# Patient Record
Sex: Female | Born: 1955
Health system: Southern US, Community
[De-identification: ages and names within clinical notes are randomized; demographics above are authoritative.]

## PROBLEM LIST (undated history)

## (undated) DIAGNOSIS — E039 Hypothyroidism, unspecified: Secondary | ICD-10-CM

## (undated) DIAGNOSIS — M810 Age-related osteoporosis without current pathological fracture: Secondary | ICD-10-CM

## (undated) DIAGNOSIS — I219 Acute myocardial infarction, unspecified: Secondary | ICD-10-CM

## (undated) DIAGNOSIS — E119 Type 2 diabetes mellitus without complications: Secondary | ICD-10-CM

## (undated) DIAGNOSIS — R911 Solitary pulmonary nodule: Secondary | ICD-10-CM

## (undated) DIAGNOSIS — G629 Polyneuropathy, unspecified: Secondary | ICD-10-CM

## (undated) DIAGNOSIS — F909 Attention-deficit hyperactivity disorder, unspecified type: Secondary | ICD-10-CM

## (undated) DIAGNOSIS — F329 Major depressive disorder, single episode, unspecified: Secondary | ICD-10-CM

## (undated) DIAGNOSIS — M1990 Unspecified osteoarthritis, unspecified site: Secondary | ICD-10-CM

## (undated) DIAGNOSIS — G43909 Migraine, unspecified, not intractable, without status migrainosus: Secondary | ICD-10-CM

## (undated) DIAGNOSIS — F32A Depression, unspecified: Secondary | ICD-10-CM

## (undated) DIAGNOSIS — Z9882 Breast implant status: Secondary | ICD-10-CM

## (undated) DIAGNOSIS — C801 Malignant (primary) neoplasm, unspecified: Secondary | ICD-10-CM

## (undated) DIAGNOSIS — I2510 Atherosclerotic heart disease of native coronary artery without angina pectoris: Secondary | ICD-10-CM

## (undated) DIAGNOSIS — Z86010 Personal history of colonic polyps: Secondary | ICD-10-CM

## (undated) DIAGNOSIS — F419 Anxiety disorder, unspecified: Secondary | ICD-10-CM

## (undated) HISTORY — PX: OTHER SURGICAL HISTORY: SHX169

## (undated) HISTORY — DX: Migraine, unspecified, not intractable, without status migrainosus: G43.909

## (undated) HISTORY — DX: Personal history of colonic polyps: Z86.010

## (undated) HISTORY — PX: BILATERAL CARPAL TUNNEL RELEASE: SHX6508

## (undated) HISTORY — DX: Malignant (primary) neoplasm, unspecified: C80.1

## (undated) HISTORY — PX: BLEPHAROPLASTY: SUR158

## (undated) HISTORY — DX: Unspecified osteoarthritis, unspecified site: M19.90

## (undated) HISTORY — DX: Anxiety disorder, unspecified: F41.9

## (undated) HISTORY — DX: Major depressive disorder, single episode, unspecified: F32.9

## (undated) HISTORY — PX: APPENDECTOMY: SHX54

## (undated) HISTORY — DX: Type 2 diabetes mellitus without complications: E11.9

## (undated) HISTORY — DX: Hypothyroidism, unspecified: E03.9

## (undated) HISTORY — DX: Acute myocardial infarction, unspecified: I21.9

## (undated) HISTORY — PX: SHOULDER SURGERY: SHX246

## (undated) HISTORY — DX: Depression, unspecified: F32.A

## (undated) HISTORY — PX: CATARACT EXTRACTION: SUR2

## (undated) HISTORY — DX: Breast implant status: Z98.82

## (undated) HISTORY — DX: Atherosclerotic heart disease of native coronary artery without angina pectoris: I25.10

## (undated) HISTORY — DX: Solitary pulmonary nodule: R91.1

## (undated) HISTORY — PX: ABDOMINAL HYSTERECTOMY: SHX81

## (undated) HISTORY — DX: Age-related osteoporosis without current pathological fracture: M81.0

## (undated) HISTORY — DX: Polyneuropathy, unspecified: G62.9

## (undated) HISTORY — DX: Attention-deficit hyperactivity disorder, unspecified type: F90.9

---

## 2005-06-15 ENCOUNTER — Emergency Department: Payer: Self-pay | Admitting: General Practice

## 2009-10-17 DIAGNOSIS — F909 Attention-deficit hyperactivity disorder, unspecified type: Secondary | ICD-10-CM | POA: Insufficient documentation

## 2009-10-21 DIAGNOSIS — E559 Vitamin D deficiency, unspecified: Secondary | ICD-10-CM | POA: Insufficient documentation

## 2009-10-24 DIAGNOSIS — IMO0002 Reserved for concepts with insufficient information to code with codable children: Secondary | ICD-10-CM | POA: Insufficient documentation

## 2009-10-24 DIAGNOSIS — E1039 Type 1 diabetes mellitus with other diabetic ophthalmic complication: Secondary | ICD-10-CM | POA: Insufficient documentation

## 2010-06-29 DIAGNOSIS — C801 Malignant (primary) neoplasm, unspecified: Secondary | ICD-10-CM

## 2010-06-29 HISTORY — PX: MASTECTOMY: SHX3

## 2010-06-29 HISTORY — DX: Malignant (primary) neoplasm, unspecified: C80.1

## 2011-06-30 DIAGNOSIS — I219 Acute myocardial infarction, unspecified: Secondary | ICD-10-CM

## 2011-06-30 HISTORY — DX: Acute myocardial infarction, unspecified: I21.9

## 2011-06-30 HISTORY — PX: CORONARY ARTERY BYPASS GRAFT: SHX141

## 2012-02-01 DIAGNOSIS — Z79899 Other long term (current) drug therapy: Secondary | ICD-10-CM | POA: Insufficient documentation

## 2012-02-01 DIAGNOSIS — R5383 Other fatigue: Secondary | ICD-10-CM | POA: Insufficient documentation

## 2012-02-01 DIAGNOSIS — T148XXA Other injury of unspecified body region, initial encounter: Secondary | ICD-10-CM | POA: Insufficient documentation

## 2012-02-01 DIAGNOSIS — M791 Myalgia, unspecified site: Secondary | ICD-10-CM | POA: Insufficient documentation

## 2013-09-25 DIAGNOSIS — E1110 Type 2 diabetes mellitus with ketoacidosis without coma: Secondary | ICD-10-CM | POA: Insufficient documentation

## 2015-02-13 LAB — HM DIABETES EYE EXAM

## 2015-04-08 ENCOUNTER — Other Ambulatory Visit: Payer: Self-pay

## 2015-04-08 ENCOUNTER — Encounter: Payer: Self-pay | Admitting: Family Medicine

## 2015-04-08 ENCOUNTER — Ambulatory Visit (INDEPENDENT_AMBULATORY_CARE_PROVIDER_SITE_OTHER): Payer: BLUE CROSS/BLUE SHIELD

## 2015-04-08 ENCOUNTER — Ambulatory Visit (INDEPENDENT_AMBULATORY_CARE_PROVIDER_SITE_OTHER): Payer: BLUE CROSS/BLUE SHIELD | Admitting: Family Medicine

## 2015-04-08 VITALS — BP 118/72 | HR 80 | Temp 98.1°F | Resp 16 | Ht 67.0 in | Wt 171.3 lb

## 2015-04-08 DIAGNOSIS — G609 Hereditary and idiopathic neuropathy, unspecified: Secondary | ICD-10-CM | POA: Insufficient documentation

## 2015-04-08 DIAGNOSIS — Z951 Presence of aortocoronary bypass graft: Secondary | ICD-10-CM | POA: Diagnosis not present

## 2015-04-08 DIAGNOSIS — M79671 Pain in right foot: Secondary | ICD-10-CM

## 2015-04-08 DIAGNOSIS — F5104 Psychophysiologic insomnia: Secondary | ICD-10-CM | POA: Insufficient documentation

## 2015-04-08 DIAGNOSIS — K5900 Constipation, unspecified: Secondary | ICD-10-CM

## 2015-04-08 DIAGNOSIS — Z23 Encounter for immunization: Secondary | ICD-10-CM

## 2015-04-08 DIAGNOSIS — H359 Unspecified retinal disorder: Secondary | ICD-10-CM

## 2015-04-08 DIAGNOSIS — K5909 Other constipation: Secondary | ICD-10-CM | POA: Insufficient documentation

## 2015-04-08 DIAGNOSIS — R197 Diarrhea, unspecified: Secondary | ICD-10-CM | POA: Diagnosis not present

## 2015-04-08 DIAGNOSIS — R140 Abdominal distension (gaseous): Secondary | ICD-10-CM

## 2015-04-08 DIAGNOSIS — E1140 Type 2 diabetes mellitus with diabetic neuropathy, unspecified: Secondary | ICD-10-CM

## 2015-04-08 DIAGNOSIS — M797 Fibromyalgia: Secondary | ICD-10-CM | POA: Insufficient documentation

## 2015-04-08 DIAGNOSIS — E109 Type 1 diabetes mellitus without complications: Secondary | ICD-10-CM | POA: Insufficient documentation

## 2015-04-08 DIAGNOSIS — E11319 Type 2 diabetes mellitus with unspecified diabetic retinopathy without macular edema: Secondary | ICD-10-CM | POA: Insufficient documentation

## 2015-04-08 DIAGNOSIS — F349 Persistent mood [affective] disorder, unspecified: Secondary | ICD-10-CM | POA: Insufficient documentation

## 2015-04-08 DIAGNOSIS — Z794 Long term (current) use of insulin: Secondary | ICD-10-CM

## 2015-04-08 DIAGNOSIS — F32A Depression, unspecified: Secondary | ICD-10-CM | POA: Insufficient documentation

## 2015-04-08 DIAGNOSIS — G4700 Insomnia, unspecified: Secondary | ICD-10-CM

## 2015-04-08 NOTE — Progress Notes (Signed)
Subjective:    Patient ID: Alejandra Valdez, female    DOB: 09/03/1955, 59 y.o.   MRN: [id number]  HPI Hit top of right foot about 2 days ago and it is bruised and swollen.  She is able to walk on it but it's painful. No prior injury to that foot.  Diabetes - no hypoglycemic events. No wounds or sores that are not healing well. No increased thirst or urination. Checking glucose at home. Taking medications as prescribed without any side effects. She has an insulin pump and has made an appointment with Dr. Steffanie Dunn here in the local community, he is an endocrinologist. She would like referral to podiatry as well.   Constipation- she usually takes 2 Senokot an herbal supplement to help her bowels moved. She complains that she always feels bloated with some GI discomfort. She occasionally will actually have diarrhea but this is not often. She really keeps dairy to a minimum. She's never been evaluated for gluten insensitivity. She had a colonoscopy about a year ago which was normal. The one prior to that she had a polyp.  History of breast cancer with bilateral mastectomy. She did have a breast MRI in 2014.  History of CABG 4-needs referral placed for cardiology. She just sees him yearly now.   Review of Systems  Constitutional: Positive for diaphoresis and unexpected weight change. Negative for fever.  HENT: Positive for hearing loss. Negative for rhinorrhea and tinnitus.   Eyes: Negative for visual disturbance.  Respiratory: Positive for cough. Negative for wheezing.   Cardiovascular: Negative for chest pain and palpitations.  Gastrointestinal: Positive for diarrhea. Negative for nausea, vomiting and blood in stool.  Genitourinary: Negative for vaginal bleeding, vaginal discharge and difficulty urinating.  Musculoskeletal: Positive for myalgias. Negative for arthralgias.  Skin: Negative for rash.  Neurological: Positive for headaches.  Hematological: Negative for adenopathy. Does not  bruise/bleed easily.  Psychiatric/Behavioral: Positive for sleep disturbance and dysphoric mood. The patient is nervous/anxious.     BP 118/72 mmHg  Pulse 80  Temp(Src) 98.1 F (36.7 C) (Oral)  Resp 16  Ht 5\' 7"  (1.702 m)  Wt 171 lb 4.8 oz (77.701 kg)  BMI 26.82 kg/m2  SpO2 100%    Allergies  Allergen Reactions  . Vicodin [Hydrocodone-Acetaminophen]     Past Medical History  Diagnosis Date  . Cancer [facility]) 2012    Breast  . History of colon polyps   . Migraine   . Osteoporosis   . Arthritis     Of spine DDD/DJD  . Lung nodule     Stable per CT 05/2013  . Anxiety   . Depression   . Diabetes mellitus without complication ([place])   . ADHD (attention deficit hyperactivity disorder)   . Neuropathy ([place])   . Myocardial infarction [facility]) 2013    Past Surgical History  Procedure Laterality Date  . Appendectomy    . Blepharoplasty Bilateral   . Abdominal hysterectomy      has left ovary  . Bilateral carpal tunnel release    . Coronary artery bypass graft  2013    x4  . Mastectomy Bilateral 2012  . Shoulder surgery Bilateral   . Cataract extraction Bilateral   . Other surgical history      Retinal repair  . Cesarean section      x2    Social History   Social History  . Marital Status: Married    Spouse Name: N/A  . Number of Children: N/A  . Years  of Education: N/A   Occupational History  . Disabled    Social History Main Topics  . Smoking status: Never Smoker   . Smokeless tobacco: Not on file  . Alcohol Use: Not on file  . Drug Use: No  . Sexual Activity: No   Other Topics Concern  . Not on file   Social History Narrative   No daily caffeine intake. Walks for 30 minutes for exercise. She is currently disabled.    Family History  Problem Relation Age of Onset  . Depression Mother   . Alcohol abuse Father   . Hyperlipidemia Father   . Hypertension Father   . Stroke Father   . Alcohol abuse Brother   . Depression Brother   . Hyperlipidemia  Brother   . Hypertension Brother   . Cancer Maternal Grandmother   . Heart attack Maternal Grandmother   . Diabetes Maternal Grandfather   . Alcohol abuse Brother   . Depression Brother   . Hyperlipidemia Brother   . Hypertension Brother     Outpatient Encounter Prescriptions as of 04/08/2015  Medication Sig  . acetaminophen-codeine (TYLENOL #3) 300-30 MG tablet Take by mouth as needed for moderate pain.  Marland Kitchen BAYER CONTOUR NEXT TEST test strip 150 each as needed.  . clonazePAM (KLONOPIN) 1 MG tablet Take 1 mg by mouth 2 (two) times daily as needed.  . cycloSPORINE (RESTASIS) 0.05 % ophthalmic emulsion 1 drop 2 (two) times daily.  Marland Kitchen desvenlafaxine (PRISTIQ) 50 MG 24 hr tablet Take 50 mg by mouth daily.  . DULoxetine (CYMBALTA) 60 MG capsule Take 60 mg by mouth daily.  Marland Kitchen ezetimibe (ZETIA) 10 MG tablet Take 10 mg by mouth daily.  . fludrocortisone (FLORINEF) 0.1 MG tablet Take 0.1 mg by mouth daily.  Marland Kitchen ibandronate (BONIVA) 150 MG tablet Take 150 mg by mouth every 30 (thirty) days.  . insulin aspart (NOVOLOG FLEXPEN) 100 UNIT/ML FlexPen Inject 5 Units into the skin 3 (three) times daily with meals.  . insulin aspart (NOVOLOG) 100 UNIT/ML injection Inject into the skin as needed for high blood sugar.  . insulin NPH Human (NOVOLIN N) 100 UNIT/ML injection Inject into the skin.  . INTERMEZZO 1.75 MG SUBL Place 1.75 mg under the tongue at bedtime as needed.  . lamoTRIgine (LAMICTAL) 100 MG tablet Take 100 mg by mouth daily.  Marland Kitchen LATUDA 20 MG TABS tablet Take 20 mg by mouth at bedtime.  Marland Kitchen levothyroxine (SYNTHROID, LEVOTHROID) 137 MCG tablet Take 137 mcg by mouth daily before breakfast.  . Liraglutide (VICTOZA) 18 MG/3ML SOPN Inject 3 mLs into the skin 3 (three) times daily.  Marland Kitchen lisdexamfetamine (VYVANSE) 30 MG capsule Take 30 mg by mouth daily.  Marland Kitchen lisinopril (PRINIVIL,ZESTRIL) 2.5 MG tablet Take 2.5 mg by mouth daily.  . metoprolol succinate (TOPROL-XL) 50 MG 24 hr tablet Take 50 mg by mouth daily.   . naratriptan (AMERGE) 2.5 MG tablet Take 2.5 mg by mouth. Take one tablet at onset of migraine do not take more than 1 a day  . nortriptyline (PAMELOR) 10 MG capsule Take 10 mg by mouth 3 (three) times daily.  Marland Kitchen omeprazole (PRILOSEC) 40 MG capsule Take 40 mg by mouth as needed.  . ondansetron (ZOFRAN) 4 MG tablet Take 4 mg by mouth every 8 (eight) hours as needed for nausea or vomiting.  . prochlorperazine (COMPAZINE) 10 MG tablet Take 10 mg by mouth as needed for nausea or vomiting.  . propranolol (INNOPRAN XL) 80 MG 24 hr capsule Take  80 mg by mouth at bedtime.  . Sulfacetamide Sodium-Sulfur (AVAR LS CLEANSER EX) Apply topically.  . SUMAtriptan Succinate (ONZETRA XSAIL) 11 MG/NOSEPC EXHP Place into the nose.  . verapamil (VERELAN PM) 120 MG 24 hr capsule Take 120 mg by mouth every morning.  . zolpidem (AMBIEN) 10 MG tablet Take 10 mg by mouth at bedtime as needed.   No facility-administered encounter medications on file as of 04/08/2015.          Objective:   Physical Exam  Constitutional: She is oriented to person, place, and time. She appears well-developed and well-nourished.  HENT:  Head: Normocephalic and atraumatic.  Right Ear: External ear normal.  Left Ear: External ear normal.  Nose: Nose normal.  Eyes: Conjunctivae and EOM are normal. Pupils are equal, round, and reactive to light.  Neck: Neck supple. No thyromegaly present.  Cardiovascular: Normal rate, regular rhythm and normal heart sounds.   Pulmonary/Chest: Effort normal and breath sounds normal. She has no wheezes.  Musculoskeletal:  Topic distal right foot is bruised with 1+ pitting edema. She has point tenderness at the distal head of the fourth metatarsal.  Bruising along half of the fourth toe. She is able to flex and extend the toes.  Lymphadenopathy:    She has no cervical adenopathy.  Neurological: She is alert and oriented to person, place, and time.  Skin: Skin is warm and dry.  Psychiatric: She has  a normal mood and affect.          Assessment & Plan:  Right foot pain-will get x-ray to evaluate for possible fracture at the distal head of the fourth metatarsal. We'll call with results once available. Next  Constipation-recommend using MiraLAX twice a day. Though she may need to use something like magnesium citrate to help to clean out initially and then start the MiraLAX. Explained this is safe to use indefinitely. Because she also has intermittent diarrhea we'll also evaluate her for gluten insensitivity. Also discussed with her that she may have decreased production of lactase which can affect her ability to break down dairy products.  History of retinal tear-will get her established with a local ophthalmologist. She is now going yearly. Next  Diabetes-RT has appointment scheduled with Dr. Steffanie Dunn and we will refer her to podiatry for foot care as well. Next  Migraines-she would like to be seen by a neurologist for this. Next  Mood disorder unspecified-will refer to psychiatry for further evaluation. I'm not sure for formal diagnosis but she is currently on Latda which is a mood stabilizer. We discussed the importance of eventually weaning her off the clonazepam. She's using it twice a day for maintenance therapy and we discussed the long-term risks of dementia and Alzheimer's.  Coronary artery disease-will refer to cardiology for yearly evaluation.

## 2015-04-09 ENCOUNTER — Encounter: Payer: Self-pay | Admitting: Family Medicine

## 2015-04-09 DIAGNOSIS — R911 Solitary pulmonary nodule: Secondary | ICD-10-CM | POA: Insufficient documentation

## 2015-04-09 DIAGNOSIS — Z853 Personal history of malignant neoplasm of breast: Secondary | ICD-10-CM | POA: Insufficient documentation

## 2015-04-09 DIAGNOSIS — F09 Unspecified mental disorder due to known physiological condition: Secondary | ICD-10-CM | POA: Insufficient documentation

## 2015-04-09 DIAGNOSIS — M810 Age-related osteoporosis without current pathological fracture: Secondary | ICD-10-CM | POA: Insufficient documentation

## 2015-04-09 DIAGNOSIS — E039 Hypothyroidism, unspecified: Secondary | ICD-10-CM | POA: Insufficient documentation

## 2015-04-09 DIAGNOSIS — E785 Hyperlipidemia, unspecified: Secondary | ICD-10-CM | POA: Insufficient documentation

## 2015-04-09 DIAGNOSIS — M5136 Other intervertebral disc degeneration, lumbar region: Secondary | ICD-10-CM | POA: Insufficient documentation

## 2015-04-12 LAB — COMPLETE METABOLIC PANEL WITH GFR
ALBUMIN: 4 g/dL (ref 3.6–5.1)
ALK PHOS: 67 U/L (ref 33–130)
ALT: 56 U/L — AB (ref 6–29)
AST: 46 U/L — ABNORMAL HIGH (ref 10–35)
BILIRUBIN TOTAL: 0.5 mg/dL (ref 0.2–1.2)
BUN: 15 mg/dL (ref 7–25)
CO2: 29 mmol/L (ref 20–31)
CREATININE: 0.73 mg/dL (ref 0.50–1.05)
Calcium: 9.1 mg/dL (ref 8.6–10.4)
Chloride: 98 mmol/L (ref 98–110)
GFR, Est Non African American: >89 mL/min (ref >=60)
GLUCOSE: 180 mg/dL — AB (ref 65–99)
Potassium: 5 mmol/L (ref 3.5–5.3)
SODIUM: 137 mmol/L (ref 135–146)
TOTAL PROTEIN: 6.3 g/dL (ref 6.1–8.1)

## 2015-04-12 LAB — CBC
HCT: 44.4 % (ref 36.0–46.0)
Hemoglobin: 14.6 g/dL (ref 12.0–15.0)
MCH: 30.5 pg (ref 26.0–34.0)
MCHC: 32.9 g/dL (ref 30.0–36.0)
MCV: 92.9 fL (ref 78.0–100.0)
MPV: 9.4 fL (ref 8.6–12.4)
PLATELETS: 253 10*3/uL (ref 150–400)
RBC: 4.78 MIL/uL (ref 3.87–5.11)
RDW: 13.1 % (ref 11.5–15.5)
WBC: 4.3 10*3/uL (ref 4.0–10.5)

## 2015-04-12 LAB — ALLERGEN MILK

## 2015-04-12 LAB — HEMOGLOBIN A1C
Hgb A1c MFr Bld: 9.2 % — ABNORMAL HIGH (ref <5.7)
Mean Plasma Glucose: 217 mg/dL — ABNORMAL HIGH (ref <117)

## 2015-04-12 LAB — LIPID PANEL
Cholesterol: 163 mg/dL (ref 125–200)
HDL: 62 mg/dL (ref >=46)
LDL CALC: 85 mg/dL (ref <130)
Total CHOL/HDL Ratio: 2.6 Ratio (ref <=5.0)
Triglycerides: 80 mg/dL (ref <150)
VLDL: 16 mg/dL (ref <30)

## 2015-04-12 LAB — GLIA (IGA/G) + TTG IGA
GLIADIN IGA: 1 U (ref <20)
GLIADIN IGG: 1 U (ref <20)
TISSUE TRANSGLUTAMINASE AB, IGA: 1 U/mL (ref <4)

## 2015-04-12 LAB — CORTISOL: Cortisol, Plasma: 12.6 ug/dL

## 2015-04-12 LAB — TSH: TSH: 1.839 u[IU]/mL (ref 0.350–4.500)

## 2015-04-15 ENCOUNTER — Telehealth: Payer: Self-pay

## 2015-04-15 NOTE — Telephone Encounter (Signed)
Pt called inquiring about lab results. Informed of the 9.2 A1C, pt states she had some "hiccups" with her insulin pump. Does not have an appointment with Endocrinology (Dr. Albin Felling)  until 10/31. Please advise.

## 2015-04-17 ENCOUNTER — Telehealth: Payer: Self-pay

## 2015-04-17 MED ORDER — CYCLOSPORINE 0.05 % OP EMUL: 1.0000 [drp] | Freq: Two times a day (BID) | OPHTHALMIC | Status: AC

## 2015-04-17 MED ORDER — METOPROLOL TARTRATE 50 MG PO TABS: 50.0000 mg | ORAL_TABLET | Freq: Two times a day (BID) | ORAL | Status: DC

## 2015-04-17 MED ORDER — DULOXETINE HCL 60 MG PO CPEP: 60.0000 mg | ORAL_CAPSULE | Freq: Every day | ORAL | Status: DC

## 2015-04-17 MED ORDER — VERAPAMIL HCL ER 120 MG PO CP24: 120.0000 mg | ORAL_CAPSULE | Freq: Every morning | ORAL | Status: DC

## 2015-04-17 MED ORDER — NORTRIPTYLINE HCL 10 MG PO CAPS: 10.0000 mg | ORAL_CAPSULE | Freq: Three times a day (TID) | ORAL | Status: DC

## 2015-04-17 NOTE — Telephone Encounter (Signed)
Pt called for rx refill 

## 2015-04-19 ENCOUNTER — Telehealth: Payer: Self-pay | Admitting: Family Medicine

## 2015-04-19 NOTE — Telephone Encounter (Signed)
Received prior authorization on Restasis sent through cover my meds waiting on authorization. - CF

## 2015-04-23 NOTE — Telephone Encounter (Signed)
Received fax from [facility] they denied coverage on Restasis due to member not requiring  Frequent use of lubricating solution/ointment or other treatments punctal plugs or goggles. Reference # [id number]. - CF

## 2015-04-24 ENCOUNTER — Telehealth: Payer: Self-pay | Admitting: Family Medicine

## 2015-04-24 NOTE — Telephone Encounter (Signed)
Pt states she will call opthalmology for a refill of restasis. Pt would like a refill of her ambien as well. Please advise if this is ok.

## 2015-04-24 NOTE — Telephone Encounter (Signed)
Call patient: Please let her know that her Restasis eyedrops were not covered by insurance. She will have to get these through her eye doctor to get them approved. I'm not sure if she is established with an eye doctor here locally or not.

## 2015-04-25 MED ORDER — ZOLPIDEM TARTRATE 10 MG PO TABS: 10.0000 mg | ORAL_TABLET | Freq: Every evening | ORAL | Status: DC | PRN

## 2015-04-25 NOTE — Telephone Encounter (Signed)
Ok to refill 

## 2015-04-25 NOTE — Telephone Encounter (Signed)
Medication refilled and faxed to pharmacy.  

## 2015-04-30 ENCOUNTER — Other Ambulatory Visit: Payer: Self-pay | Admitting: Family Medicine

## 2015-04-30 DIAGNOSIS — F09 Unspecified mental disorder due to known physiological condition: Secondary | ICD-10-CM

## 2015-04-30 DIAGNOSIS — F349 Persistent mood [affective] disorder, unspecified: Secondary | ICD-10-CM

## 2015-05-02 ENCOUNTER — Other Ambulatory Visit: Payer: Self-pay | Admitting: Family Medicine

## 2015-05-03 NOTE — Telephone Encounter (Signed)
Contacted pt states she has not gotten an appointment with Dr. Dahlia Client for mood. Informed pt we will send in a 30 day supply and asked that she contacts [place] when she has an appointment.

## 2015-05-20 ENCOUNTER — Ambulatory Visit (INDEPENDENT_AMBULATORY_CARE_PROVIDER_SITE_OTHER): Payer: BLUE CROSS/BLUE SHIELD | Admitting: Family Medicine

## 2015-05-20 ENCOUNTER — Encounter: Payer: Self-pay | Admitting: Family Medicine

## 2015-05-20 VITALS — BP 126/80 | HR 87 | Temp 98.5°F | Resp 18 | Wt 180.7 lb

## 2015-05-20 DIAGNOSIS — Z951 Presence of aortocoronary bypass graft: Secondary | ICD-10-CM

## 2015-05-20 DIAGNOSIS — Z79899 Other long term (current) drug therapy: Secondary | ICD-10-CM | POA: Diagnosis not present

## 2015-05-20 DIAGNOSIS — L578 Other skin changes due to chronic exposure to nonionizing radiation: Secondary | ICD-10-CM | POA: Diagnosis not present

## 2015-05-20 DIAGNOSIS — J069 Acute upper respiratory infection, unspecified: Secondary | ICD-10-CM

## 2015-05-20 DIAGNOSIS — G43809 Other migraine, not intractable, without status migrainosus: Secondary | ICD-10-CM

## 2015-05-20 MED ORDER — IBANDRONATE SODIUM 150 MG PO TABS: 150.0000 mg | ORAL_TABLET | ORAL | Status: DC

## 2015-05-20 NOTE — Progress Notes (Signed)
Subjective:    Patient ID: Alejandra Valdez, female    DOB: 1956-05-25, 59 y.o.   MRN: [id number]  HPI  she's here today for medication review. When she came in for her initial visit she was on quite a few medications I really wanted t have a visit specifically dedicated to going through each individual medication t understand why sh takes it and if itneds to be continued or adjusted.  Upper respiratory infection -  She's felt fatigued and had some sinus congestion over the last 3 das. No fevers chills or sweats. She does feel run down. They just came back from [facility].  She's pressure because she is gaining weight-  She has tried eating more healthy. In fact recently she was able to lose 20 pounds but was having some difficulty with her insulin pump so has gained it back.   Review of Systems     Objective:   Physical Exam  Constitutional: She is oriented to person, place, and time. She appears well-developed and well-nourished.  HENT:  Head: Normocephalic and atraumatic.  Right Ear: External ear normal.  Left Ear: External ear normal.  Nose: Nose normal.  Mouth/Throat: Oropharynx is clear and moist.  TMs and canals are clear.   Eyes: Conjunctivae and EOM are normal. Pupils are equal, round, and reactive to light.  Neck: Neck supple. No thyromegaly present.  Cardiovascular: Normal rate, regular rhythm and normal heart sounds.   Pulmonary/Chest: Effort normal and breath sounds normal. She has no wheezes.  Lymphadenopathy:    She has no cervical adenopathy.  Neurological: She is alert and oriented to person, place, and time.  Skin: Skin is warm and dry.  Psychiatric: She has a normal mood and affect.          Assessment & Plan:   medication management- after going through her medications we  Cleaned up her list and removed several insulin products. She's also no longer on Vyvanse as her diagnosis of ADD was retracted. I made several suggestions to her including:  #1 Ask  cardiologist if can decrease metoprolol to 25 mg extended release since she is taking the Florinef to counteract low blood pressure. #2 there are 2 nausea medications on the list, again Cintron and prochlorperazine. Please let me know which one you would like to stick with. #3 speak with the  neurologist/headache specialist to see if he might be able to decrease the nortriptyline to 2 at bedtime. #4 Please let me know which statin you are taking.? Simvastatin. If your on this, then does she need to continue the Zetia? This is a good question for the cardiologist. #5 make sure not taking propranolol since you are on metoprolol.   URI -  Likely viral. Gave her reassurance. Okay to continue symptomatic care. Call if not better before the holidays as we are closed on Thursday and Friday of this week.   Coronary artery disease-her cardiologist did switch her  Metoprolol to 50 mg extended release. I do think she should speak with her cardiologst about possibly decreasing some of her blood pressure medications so that she may or may not have to use the Florinef. I do understand with her coronary artery disease that she really needs to be on a beta blocker and ACE inhibitor but certainly the metoprolol could be decreased. Also her verapamil could be potentially decreased as well but I'm not super familia with her cardiac history.  Migraine headaches- I did encourage her to speak with her  neurologist I would really like to work on decreasing her nortriptyline  Osteoporosis-I did refill her Jaclyn Prime today fr a year.

## 2015-05-20 NOTE — Patient Instructions (Signed)
#  1 Ask cardiologist if can decrease metoprolol to 25 mg extended release since she is taking the Florinef to counteract low blood pressure. #2 there are 2 nausea medications on the list, again Cintron and prochlorperazine. Please let me know which one you would like to stick with. #3 speak with the  neurologist/headache specialist to see if he might be able to decrease the nortriptyline to 2 at bedtime. #4 Please let me know which statin you are taking.? Simvastatin. If your on this, then does she need to continue the Zetia? This is a good question for the cardiologist. #5 make sure not taking propranolol since you are on metoprolol.

## 2015-05-28 ENCOUNTER — Other Ambulatory Visit: Payer: Self-pay

## 2015-05-28 MED ORDER — ZOLPIDEM TARTRATE 10 MG PO TABS: 10.0000 mg | ORAL_TABLET | Freq: Every evening | ORAL | Status: DC | PRN

## 2015-05-30 ENCOUNTER — Other Ambulatory Visit: Payer: Self-pay | Admitting: Family Medicine

## 2015-05-30 NOTE — Telephone Encounter (Signed)
Please advise 

## 2015-05-31 ENCOUNTER — Other Ambulatory Visit: Payer: Self-pay

## 2015-05-31 NOTE — Telephone Encounter (Signed)
HOP called giving information from [place] cardiology visit. Cardiologists ok'd decrease of metoprolol, pt is no longer taking florinef but is taking lactinex OTC. Pt is now only taking zofran for antiemetic.

## 2015-05-31 NOTE — Telephone Encounter (Signed)
Sounds good. I removed the Florinef and the second nausea medicine off her list. We will keep the Zofran. Also if she still has some of the metoprolol 50 mg tabs she can split them in half and take half a tab daily. We can differently send over new prescription for the 25 mg dose and that way she doesn't have to split it. I entered the prescription but I did not hit send because I wasn't sure if she needed it sent to [facility] locally or in [place].

## 2015-06-05 ENCOUNTER — Ambulatory Visit: Payer: BLUE CROSS/BLUE SHIELD | Admitting: Cardiology

## 2015-06-06 MED ORDER — METOPROLOL SUCCINATE ER 25 MG PO TB24
25.0000 mg | ORAL_TABLET | Freq: Every day | ORAL | Status: DC
Start: 2015-06-06 — End: 2015-11-27

## 2015-06-10 ENCOUNTER — Other Ambulatory Visit: Payer: Self-pay | Admitting: Family Medicine

## 2015-06-16 ENCOUNTER — Other Ambulatory Visit: Payer: Self-pay | Admitting: Family Medicine

## 2015-06-20 ENCOUNTER — Telehealth: Payer: Self-pay | Admitting: Family Medicine

## 2015-06-20 NOTE — Telephone Encounter (Signed)
Pt called office stating she can not get in with her psych until mid January. Wants to know if her Latuda Rx can be refilled from PCP. Will route to PCP and a Provider in office for review.

## 2015-06-21 MED ORDER — LATUDA 20 MG PO TABS: 20.0000 mg | ORAL_TABLET | Freq: Every day | ORAL | Status: DC

## 2015-06-21 NOTE — Telephone Encounter (Signed)
rx sent to her [facility] on file

## 2015-06-26 LAB — HEMOGLOBIN A1C: Hemoglobin A1C: 9.6

## 2015-06-26 LAB — VITAMIN B12

## 2015-06-26 LAB — HOMOCYSTEINE: Homocysteine: 4.7

## 2015-07-03 ENCOUNTER — Telehealth: Payer: Self-pay | Admitting: Family Medicine

## 2015-07-03 MED ORDER — LISINOPRIL 2.5 MG PO TABS: 2.5000 mg | ORAL_TABLET | Freq: Every day | ORAL | Status: DC

## 2015-07-03 MED ORDER — ZOLPIDEM TARTRATE 10 MG PO TABS: 10.0000 mg | ORAL_TABLET | Freq: Every evening | ORAL | Status: DC | PRN

## 2015-07-03 NOTE — Telephone Encounter (Signed)
Pt called clinic stating she cant get in with her Psych or her Cardio until the middle of January. Wants to know if PCP will refill Lisinopril and Ambien to hold Pt over. Will route.

## 2015-07-03 NOTE — Telephone Encounter (Signed)
Pt advised Rx was filled and faxed to pharmacy.

## 2015-07-03 NOTE — Telephone Encounter (Signed)
Ok to fill both 

## 2015-07-09 ENCOUNTER — Telehealth: Payer: Self-pay | Admitting: Family Medicine

## 2015-07-09 NOTE — Telephone Encounter (Signed)
Received fax for prior authorization on Pristiq sent through cover my meds waiting on authorization. - CF

## 2015-07-14 ENCOUNTER — Other Ambulatory Visit: Payer: Self-pay | Admitting: Family Medicine

## 2015-07-15 ENCOUNTER — Other Ambulatory Visit: Payer: Self-pay | Admitting: Family Medicine

## 2015-07-24 ENCOUNTER — Other Ambulatory Visit: Payer: Self-pay | Admitting: Family Medicine

## 2015-07-24 ENCOUNTER — Encounter: Payer: Self-pay | Admitting: Family Medicine

## 2015-07-24 DIAGNOSIS — I2510 Atherosclerotic heart disease of native coronary artery without angina pectoris: Secondary | ICD-10-CM | POA: Insufficient documentation

## 2015-07-25 DIAGNOSIS — I313 Pericardial effusion (noninflammatory): Secondary | ICD-10-CM | POA: Insufficient documentation

## 2015-07-25 DIAGNOSIS — Z87898 Personal history of other specified conditions: Secondary | ICD-10-CM | POA: Insufficient documentation

## 2015-07-25 DIAGNOSIS — I3139 Other pericardial effusion (noninflammatory): Secondary | ICD-10-CM | POA: Insufficient documentation

## 2015-07-29 ENCOUNTER — Other Ambulatory Visit: Payer: Self-pay | Admitting: Family Medicine

## 2015-07-30 NOTE — Telephone Encounter (Signed)
I would recommend we call her to confirm her dosage and see what she is actually taking at home.

## 2015-08-01 ENCOUNTER — Other Ambulatory Visit: Payer: Self-pay | Admitting: Family Medicine

## 2015-08-01 NOTE — Telephone Encounter (Signed)
Historical provider. Dr Metheney's patient.

## 2015-08-02 ENCOUNTER — Telehealth: Payer: Self-pay | Admitting: Family Medicine

## 2015-08-02 NOTE — Telephone Encounter (Signed)
Error

## 2015-08-02 NOTE — Telephone Encounter (Signed)
Follow up in next 2 months with office visit. Last TSH was 03/2015.

## 2015-08-02 NOTE — Telephone Encounter (Signed)
Received fax for prior authorization sent through cover my meds. Received fax from [facility] and they denied coverage on Latuda due to patient must try and fail two alternatives from the formulary. - CF

## 2015-08-04 ENCOUNTER — Other Ambulatory Visit: Payer: Self-pay | Admitting: Physician Assistant

## 2015-08-05 NOTE — Telephone Encounter (Signed)
Are you ok with refilling this? It's been a while since she's been in.

## 2015-08-07 NOTE — Telephone Encounter (Signed)
Received authorization from 07/09/15 - 13/31/2039 for Pristiq. - CF

## 2015-08-08 ENCOUNTER — Telehealth: Payer: Self-pay | Admitting: Family Medicine

## 2015-08-08 NOTE — Telephone Encounter (Signed)
Received fax from [facility] and they have approved Latuda from 07/25/2015 - 06/28/2038 Reference # [id number]. - CF

## 2015-08-10 ENCOUNTER — Other Ambulatory Visit: Payer: Self-pay | Admitting: Family Medicine

## 2015-08-12 NOTE — Telephone Encounter (Signed)
Called pt and informed of approval.Alejandra Valdez, Alejandra Valdez

## 2015-08-12 NOTE — Telephone Encounter (Signed)
Great!  Call pt and let her know latuda was approved.  Yeah!!!

## 2015-08-19 ENCOUNTER — Ambulatory Visit (INDEPENDENT_AMBULATORY_CARE_PROVIDER_SITE_OTHER): Payer: BLUE CROSS/BLUE SHIELD | Admitting: Family Medicine

## 2015-08-19 ENCOUNTER — Encounter: Payer: Self-pay | Admitting: Family Medicine

## 2015-08-19 VITALS — BP 128/67 | HR 94 | Wt 170.0 lb

## 2015-08-19 DIAGNOSIS — M797 Fibromyalgia: Secondary | ICD-10-CM

## 2015-08-19 DIAGNOSIS — M461 Sacroiliitis, not elsewhere classified: Secondary | ICD-10-CM

## 2015-08-19 DIAGNOSIS — E1059 Type 1 diabetes mellitus with other circulatory complications: Secondary | ICD-10-CM | POA: Diagnosis not present

## 2015-08-19 DIAGNOSIS — Z951 Presence of aortocoronary bypass graft: Secondary | ICD-10-CM | POA: Diagnosis not present

## 2015-08-19 DIAGNOSIS — M5136 Other intervertebral disc degeneration, lumbar region: Secondary | ICD-10-CM

## 2015-08-19 DIAGNOSIS — R5383 Other fatigue: Secondary | ICD-10-CM

## 2015-08-19 MED ORDER — CELECOXIB 200 MG PO CAPS: 200.0000 mg | ORAL_CAPSULE | Freq: Every day | ORAL | Status: DC | PRN

## 2015-08-19 NOTE — Progress Notes (Signed)
Subjective:    Patient ID: Alejandra Valdez, female    DOB: 06-May-1956, 60 y.o.   MRN: [id number]  HPI  She has come in today to review her medications today. I have made several recommendations to decrease and discontinue multiple medicines from her list. She was able to meet with her cardiologist and they were able to make some adjustments. Her metoprolol has been decreased down to 25 mg. They stopped the Florinef. They also stopped her verapamil. She is also on a lower dose of the amitriptyline at bedtime. She is now gotten in with psychiatry and has been established. They also discontinued her Ambien and put her on temazepam at bedtime. She is going to only use Zofran for nausea as needed.  She has been more fatigued and tired the last 2 weeks.  She's not sure why. She denies any recent fevers chills or sweats or upper respiratory infections. She has had more back pain the last couple of weeks as well.  Has had more midback and neck pain - he says she has a prior history of degenerative disc disease and has even had injections in her back. She denies any recent injury or heavy lifting that she thinks may have triggered this. she has been dx with fibromyalgia as well.  She has been nauseated.  No urinary sxs.  She has some reflux but has been taking her prilosec.  Has moe hear racing.  No chiropractor. Has tried heating pad.    She has lost about 10 lbs since being off the Florinef.    Diabetes, type 1 - her last him 11 A1c was 9.4 which is not well controlled. Unfortunately she's had some problems with the sensor on her pump. She says the additional parts actually came in this morning says she's hoping to get back on track. She currently follows with Dr. Steffanie Dunn, endocrinology. We will try to get up-to-date records   Review of Systems She has been having more frequent headaches as well.    Objective:   Physical Exam  Constitutional: She is oriented to person, place, and time. She appears  well-developed and well-nourished.  HENT:  Head: Normocephalic and atraumatic.  Eyes: Conjunctivae and EOM are normal.  Cardiovascular: Normal rate.   Pulmonary/Chest: Effort normal.  Musculoskeletal:  Nontender over the thoracic or lumbar spine. She actually is tender on the right paraspinous lower thoracic muscles and also tender over both SI joints. More tender on the right compared to the left. Hip flexion extension is normal. Cervical flexion extension is normal. She has decreased rotation right and left but symmetric as well as slightly decreased side bending right and left. This did not recreate any of her neck pain. Shoulders with normal range of motion.  Neurological: She is alert and oriented to person, place, and time.  Skin: Skin is dry. No pallor.  Psychiatric: She has a normal mood and affect. Her behavior is normal.  Vitals reviewed.         Assessment & Plan:  Recurrent, chronic mid back pain and cervical pain and SIjoint inflammation-recommend gentle stretches at home. She says she has tried physical therapy in the past and it really wasn't that helpful. She has responded really well to Celebrex in the past I did give her prescription to use as needed. Encouraged her to make sure that she takes with food and water and stop immediately if any GI upset. We discussed working on strengthening abdominal core muscles and also discussed some newer  studies showing that yoga can be helpful for chronic back pain.  Fatigue-unclear etiology. She's been off the Florinef for about a month and has done well until the last couple of weeks. Also consider that this could be mood related. Encouraged her to discuss with her psychiatrist as well. Her thyroid level looks great couple of months ago so unlikely to be that.  Coronary artery disease status post CABG-cardiology adjusted her regimen and took her off of a couple different medications. Her blood pressure looks fantastic today and she is  back on a statin.  I did encourage her to schedule a physical in about a month or so so that we can go over her preventative care and get things documented him back on track.  DM, type 1-currently not well controlled but she has had palms with her insulin pump. Follow-up with Dr. Steffanie Dunn.

## 2015-08-20 ENCOUNTER — Encounter: Payer: Self-pay | Admitting: Family Medicine

## 2015-08-22 ENCOUNTER — Other Ambulatory Visit: Payer: Self-pay | Admitting: Family Medicine

## 2015-08-22 ENCOUNTER — Other Ambulatory Visit: Payer: Self-pay

## 2015-08-22 NOTE — Telephone Encounter (Addendum)
Alejandra Valdez called and would like a 90 days supply of EnBrace sent to mail order pharmacy, [facility].

## 2015-08-23 MED ORDER — ENBRACE HR PO CAPS: 1.0000 | ORAL_CAPSULE | Freq: Every day | ORAL | Status: DC

## 2015-08-23 NOTE — Telephone Encounter (Signed)
Ok to sent for 90 day.

## 2015-08-23 NOTE — Telephone Encounter (Signed)
Medication sent.

## 2015-08-29 ENCOUNTER — Telehealth: Payer: Self-pay | Admitting: *Deleted

## 2015-08-29 NOTE — Telephone Encounter (Signed)
Initiated a PA for generic celebrex

## 2015-09-02 ENCOUNTER — Other Ambulatory Visit: Payer: Self-pay | Admitting: Family Medicine

## 2015-09-02 NOTE — Telephone Encounter (Signed)
celebrex has been denied by insurance. Denial letter placed in providers box

## 2015-09-03 ENCOUNTER — Telehealth: Payer: Self-pay | Admitting: *Deleted

## 2015-09-03 ENCOUNTER — Other Ambulatory Visit: Payer: Self-pay | Admitting: Family Medicine

## 2015-09-03 NOTE — Telephone Encounter (Signed)
Initiated 2nd PA for celebrex. Pt has failed Ibuprofen,naproxen, meloxicam. Initiated through covermymeds

## 2015-09-04 NOTE — Telephone Encounter (Signed)
PA for celebrex was approved from 09-03-2015 through 06-28-2038  Pt notified,pharm notified

## 2015-09-17 ENCOUNTER — Encounter: Payer: BLUE CROSS/BLUE SHIELD | Admitting: Family Medicine

## 2015-09-27 ENCOUNTER — Other Ambulatory Visit: Payer: Self-pay | Admitting: Family Medicine

## 2015-09-27 ENCOUNTER — Encounter: Payer: BLUE CROSS/BLUE SHIELD | Admitting: Family Medicine

## 2015-09-30 DIAGNOSIS — E10319 Type 1 diabetes mellitus with unspecified diabetic retinopathy without macular edema: Secondary | ICD-10-CM | POA: Diagnosis not present

## 2015-09-30 DIAGNOSIS — E039 Hypothyroidism, unspecified: Secondary | ICD-10-CM | POA: Diagnosis not present

## 2015-09-30 DIAGNOSIS — M810 Age-related osteoporosis without current pathological fracture: Secondary | ICD-10-CM | POA: Diagnosis not present

## 2015-09-30 DIAGNOSIS — E1065 Type 1 diabetes mellitus with hyperglycemia: Secondary | ICD-10-CM | POA: Diagnosis not present

## 2015-09-30 NOTE — Telephone Encounter (Signed)
Looks like patient was referred to psych back in Dec. Apparently she had an appt mid Jan. Are we still refilling this or should this be handled by her psych?

## 2015-10-01 ENCOUNTER — Telehealth: Payer: Self-pay

## 2015-10-01 NOTE — Telephone Encounter (Signed)
Yes lets try to get her in later this week or early next week.

## 2015-10-02 NOTE — Telephone Encounter (Signed)
Left message for husband to call back to schedule an appointment.

## 2015-10-03 ENCOUNTER — Encounter: Payer: BLUE CROSS/BLUE SHIELD | Admitting: Sports Medicine

## 2015-10-07 DIAGNOSIS — F322 Major depressive disorder, single episode, severe without psychotic features: Secondary | ICD-10-CM | POA: Diagnosis not present

## 2015-10-10 ENCOUNTER — Encounter: Payer: BLUE CROSS/BLUE SHIELD | Admitting: Sports Medicine

## 2015-10-14 ENCOUNTER — Encounter: Payer: BLUE CROSS/BLUE SHIELD | Admitting: Family Medicine

## 2015-10-16 DIAGNOSIS — E1065 Type 1 diabetes mellitus with hyperglycemia: Secondary | ICD-10-CM | POA: Diagnosis not present

## 2015-10-20 ENCOUNTER — Other Ambulatory Visit: Payer: Self-pay | Admitting: Family Medicine

## 2015-10-20 ENCOUNTER — Other Ambulatory Visit: Payer: Self-pay | Admitting: Physician Assistant

## 2015-10-21 NOTE — Telephone Encounter (Signed)
Lisinopri

## 2015-11-04 DIAGNOSIS — E1065 Type 1 diabetes mellitus with hyperglycemia: Secondary | ICD-10-CM | POA: Diagnosis not present

## 2015-11-05 ENCOUNTER — Other Ambulatory Visit: Payer: Self-pay | Admitting: *Deleted

## 2015-11-05 MED ORDER — SIMVASTATIN 40 MG PO TABS: 40.0000 mg | ORAL_TABLET | Freq: Every day | ORAL | Status: DC

## 2015-11-05 MED ORDER — EZETIMIBE 10 MG PO TABS: 10.0000 mg | ORAL_TABLET | Freq: Every day | ORAL | Status: DC

## 2015-11-18 ENCOUNTER — Encounter: Payer: BLUE CROSS/BLUE SHIELD | Admitting: Family Medicine

## 2015-11-22 DIAGNOSIS — Y93H2 Activity, gardening and landscaping: Secondary | ICD-10-CM | POA: Diagnosis not present

## 2015-11-22 DIAGNOSIS — Z8673 Personal history of transient ischemic attack (TIA), and cerebral infarction without residual deficits: Secondary | ICD-10-CM | POA: Diagnosis not present

## 2015-11-22 DIAGNOSIS — Z794 Long term (current) use of insulin: Secondary | ICD-10-CM | POA: Diagnosis not present

## 2015-11-22 DIAGNOSIS — Z79899 Other long term (current) drug therapy: Secondary | ICD-10-CM | POA: Diagnosis not present

## 2015-11-22 DIAGNOSIS — E103599 Type 1 diabetes mellitus with proliferative diabetic retinopathy without macular edema, unspecified eye: Secondary | ICD-10-CM | POA: Diagnosis not present

## 2015-11-22 DIAGNOSIS — Z7982 Long term (current) use of aspirin: Secondary | ICD-10-CM | POA: Diagnosis not present

## 2015-11-22 DIAGNOSIS — S90512A Abrasion, left ankle, initial encounter: Secondary | ICD-10-CM | POA: Diagnosis not present

## 2015-11-22 DIAGNOSIS — M810 Age-related osteoporosis without current pathological fracture: Secondary | ICD-10-CM | POA: Diagnosis not present

## 2015-11-22 DIAGNOSIS — M25572 Pain in left ankle and joints of left foot: Secondary | ICD-10-CM | POA: Diagnosis not present

## 2015-11-22 DIAGNOSIS — Z9641 Presence of insulin pump (external) (internal): Secondary | ICD-10-CM | POA: Diagnosis not present

## 2015-11-27 ENCOUNTER — Other Ambulatory Visit: Payer: Self-pay | Admitting: Family Medicine

## 2015-12-03 DIAGNOSIS — F322 Major depressive disorder, single episode, severe without psychotic features: Secondary | ICD-10-CM | POA: Diagnosis not present

## 2015-12-06 ENCOUNTER — Encounter: Payer: BLUE CROSS/BLUE SHIELD | Admitting: Family Medicine

## 2015-12-18 DIAGNOSIS — F322 Major depressive disorder, single episode, severe without psychotic features: Secondary | ICD-10-CM | POA: Diagnosis not present

## 2015-12-19 ENCOUNTER — Encounter: Payer: Self-pay | Admitting: Family Medicine

## 2015-12-19 DIAGNOSIS — Z951 Presence of aortocoronary bypass graft: Secondary | ICD-10-CM | POA: Diagnosis not present

## 2015-12-19 DIAGNOSIS — I2510 Atherosclerotic heart disease of native coronary artery without angina pectoris: Secondary | ICD-10-CM | POA: Diagnosis not present

## 2015-12-19 DIAGNOSIS — I313 Pericardial effusion (noninflammatory): Secondary | ICD-10-CM | POA: Diagnosis not present

## 2015-12-24 ENCOUNTER — Other Ambulatory Visit: Payer: Self-pay | Admitting: Family Medicine

## 2015-12-25 ENCOUNTER — Other Ambulatory Visit: Payer: Self-pay | Admitting: Family Medicine

## 2016-01-02 DIAGNOSIS — D225 Melanocytic nevi of trunk: Secondary | ICD-10-CM | POA: Diagnosis not present

## 2016-01-02 DIAGNOSIS — Z951 Presence of aortocoronary bypass graft: Secondary | ICD-10-CM | POA: Diagnosis not present

## 2016-01-02 DIAGNOSIS — L700 Acne vulgaris: Secondary | ICD-10-CM | POA: Diagnosis not present

## 2016-01-02 DIAGNOSIS — I313 Pericardial effusion (noninflammatory): Secondary | ICD-10-CM | POA: Diagnosis not present

## 2016-01-02 DIAGNOSIS — I2510 Atherosclerotic heart disease of native coronary artery without angina pectoris: Secondary | ICD-10-CM | POA: Diagnosis not present

## 2016-01-02 DIAGNOSIS — L814 Other melanin hyperpigmentation: Secondary | ICD-10-CM | POA: Diagnosis not present

## 2016-01-02 DIAGNOSIS — I700 Atherosclerosis of aorta: Secondary | ICD-10-CM | POA: Diagnosis not present

## 2016-01-02 DIAGNOSIS — L821 Other seborrheic keratosis: Secondary | ICD-10-CM | POA: Diagnosis not present

## 2016-01-02 DIAGNOSIS — IMO0001 Reserved for inherently not codable concepts without codable children: Secondary | ICD-10-CM | POA: Insufficient documentation

## 2016-01-03 ENCOUNTER — Encounter: Payer: BLUE CROSS/BLUE SHIELD | Admitting: Family Medicine

## 2016-01-06 DIAGNOSIS — Z7983 Long term (current) use of bisphosphonates: Secondary | ICD-10-CM | POA: Diagnosis not present

## 2016-01-06 DIAGNOSIS — M79602 Pain in left arm: Secondary | ICD-10-CM | POA: Diagnosis not present

## 2016-01-06 DIAGNOSIS — S70312A Abrasion, left thigh, initial encounter: Secondary | ICD-10-CM | POA: Diagnosis not present

## 2016-01-06 DIAGNOSIS — I2510 Atherosclerotic heart disease of native coronary artery without angina pectoris: Secondary | ICD-10-CM | POA: Diagnosis not present

## 2016-01-06 DIAGNOSIS — E103599 Type 1 diabetes mellitus with proliferative diabetic retinopathy without macular edema, unspecified eye: Secondary | ICD-10-CM | POA: Diagnosis not present

## 2016-01-06 DIAGNOSIS — Z9641 Presence of insulin pump (external) (internal): Secondary | ICD-10-CM | POA: Diagnosis not present

## 2016-01-06 DIAGNOSIS — M810 Age-related osteoporosis without current pathological fracture: Secondary | ICD-10-CM | POA: Diagnosis not present

## 2016-01-06 DIAGNOSIS — E039 Hypothyroidism, unspecified: Secondary | ICD-10-CM | POA: Diagnosis not present

## 2016-01-06 DIAGNOSIS — G43719 Chronic migraine without aura, intractable, without status migrainosus: Secondary | ICD-10-CM | POA: Diagnosis not present

## 2016-01-06 DIAGNOSIS — S80812A Abrasion, left lower leg, initial encounter: Secondary | ICD-10-CM | POA: Diagnosis not present

## 2016-01-06 DIAGNOSIS — M19012 Primary osteoarthritis, left shoulder: Secondary | ICD-10-CM | POA: Diagnosis not present

## 2016-01-06 DIAGNOSIS — F419 Anxiety disorder, unspecified: Secondary | ICD-10-CM | POA: Diagnosis not present

## 2016-01-06 DIAGNOSIS — F329 Major depressive disorder, single episode, unspecified: Secondary | ICD-10-CM | POA: Diagnosis not present

## 2016-01-06 DIAGNOSIS — E1140 Type 2 diabetes mellitus with diabetic neuropathy, unspecified: Secondary | ICD-10-CM | POA: Diagnosis not present

## 2016-01-06 DIAGNOSIS — Z7982 Long term (current) use of aspirin: Secondary | ICD-10-CM | POA: Diagnosis not present

## 2016-01-06 DIAGNOSIS — S70311A Abrasion, right thigh, initial encounter: Secondary | ICD-10-CM | POA: Diagnosis not present

## 2016-01-06 DIAGNOSIS — S2242XA Multiple fractures of ribs, left side, initial encounter for closed fracture: Secondary | ICD-10-CM | POA: Diagnosis not present

## 2016-01-06 DIAGNOSIS — Z794 Long term (current) use of insulin: Secondary | ICD-10-CM | POA: Diagnosis not present

## 2016-01-06 DIAGNOSIS — S80811A Abrasion, right lower leg, initial encounter: Secondary | ICD-10-CM | POA: Diagnosis not present

## 2016-01-06 DIAGNOSIS — S30811A Abrasion of abdominal wall, initial encounter: Secondary | ICD-10-CM | POA: Diagnosis not present

## 2016-01-07 ENCOUNTER — Ambulatory Visit: Payer: BLUE CROSS/BLUE SHIELD | Admitting: Family Medicine

## 2016-01-07 ENCOUNTER — Encounter: Payer: BLUE CROSS/BLUE SHIELD | Admitting: Family Medicine

## 2016-01-08 ENCOUNTER — Other Ambulatory Visit: Payer: Self-pay | Admitting: Family Medicine

## 2016-01-22 ENCOUNTER — Encounter: Payer: BLUE CROSS/BLUE SHIELD | Admitting: Family Medicine

## 2016-01-24 ENCOUNTER — Other Ambulatory Visit (HOSPITAL_COMMUNITY)
Admission: RE | Admit: 2016-01-24 | Discharge: 2016-01-24 | Disposition: A | Discharge status: Home / Self Care | Payer: BLUE CROSS/BLUE SHIELD | Source: Ambulatory Visit | Attending: Family Medicine | Admitting: Family Medicine

## 2016-01-24 ENCOUNTER — Encounter: Payer: Self-pay | Admitting: Family Medicine

## 2016-01-24 ENCOUNTER — Telehealth: Payer: Self-pay | Admitting: Family Medicine

## 2016-01-24 ENCOUNTER — Ambulatory Visit (INDEPENDENT_AMBULATORY_CARE_PROVIDER_SITE_OTHER): Payer: BLUE CROSS/BLUE SHIELD | Admitting: Family Medicine

## 2016-01-24 VITALS — BP 123/72 | HR 84 | Ht 67.0 in | Wt 164.0 lb

## 2016-01-24 DIAGNOSIS — Z9882 Breast implant status: Secondary | ICD-10-CM

## 2016-01-24 DIAGNOSIS — Z01419 Encounter for gynecological examination (general) (routine) without abnormal findings: Secondary | ICD-10-CM | POA: Insufficient documentation

## 2016-01-24 DIAGNOSIS — R29898 Other symptoms and signs involving the musculoskeletal system: Secondary | ICD-10-CM | POA: Diagnosis not present

## 2016-01-24 DIAGNOSIS — Z9013 Acquired absence of bilateral breasts and nipples: Secondary | ICD-10-CM

## 2016-01-24 DIAGNOSIS — E103599 Type 1 diabetes mellitus with proliferative diabetic retinopathy without macular edema, unspecified eye: Secondary | ICD-10-CM

## 2016-01-24 DIAGNOSIS — R29818 Other symptoms and signs involving the nervous system: Secondary | ICD-10-CM

## 2016-01-24 DIAGNOSIS — R2689 Other abnormalities of gait and mobility: Secondary | ICD-10-CM

## 2016-01-24 DIAGNOSIS — Z0000 Encounter for general adult medical examination without abnormal findings: Secondary | ICD-10-CM

## 2016-01-24 DIAGNOSIS — Z1151 Encounter for screening for human papillomavirus (HPV): Secondary | ICD-10-CM | POA: Insufficient documentation

## 2016-01-24 HISTORY — DX: Breast implant status: Z98.82

## 2016-01-24 LAB — POCT GLYCOSYLATED HEMOGLOBIN (HGB A1C): Hemoglobin A1C: 9.5

## 2016-01-24 LAB — MICROALBUMIN / CREATININE URINE RATIO
MICROALB/CREAT RATIO: 28.4
MICROALB/CREAT RATIO: 28.4

## 2016-01-24 NOTE — Progress Notes (Signed)
Subjective:     Alejandra Valdez is a 60 y.o. female and is here for a comprehensive physical exam. The patient reports problems - balance issues. Would like referral to PT to have them work on LE weakness and falls/balance.  She has also been concerned as her psychiatrist Dr. Ruthann Cancer thinks she may have had a stroke but she has been having memory loss.    Social History   Social History  . Marital status: Married    Spouse name: bobby   . Number of children: 1  . Years of education: N/A   Occupational History  . Disabled    Social History Main Topics  . Smoking status: Never Smoker  . Smokeless tobacco: Not on file  . Alcohol use Not on file  . Drug use: No  . Sexual activity: No   Other Topics Concern  . Not on file   Social History Narrative   No daily caffeine intake. Walks for 30 minutes for exercise. She is currently disabled.   Health Maintenance  Topic Date Due  . Hepatitis C Screening  1955-09-12  . FOOT EXAM  05/18/1968  . OPHTHALMOLOGY EXAM  05/18/1968  . HIV Screening  05/18/1973  . COLONOSCOPY  05/18/2008  . INFLUENZA VACCINE  01/28/2016  . HEMOGLOBIN A1C  07/26/2016  . URINE MICROALBUMIN  07/29/2016  . PNEUMOCOCCAL POLYSACCHARIDE VACCINE (2) 06/29/2018  . TETANUS/TDAP  06/29/2021   Immunization History  Administered Date(s) Administered  . Influenza,inj,Quad PF,36+ Mos 04/08/2015  . Pneumococcal Polysaccharide-23 06/29/2013  . Tdap 06/30/2011  . Zoster 06/29/2013     The following portions of the patient's history were reviewed and updated as appropriate: allergies, current medications, past family history, past medical history, past social history, past surgical history and problem list.  Review of Systems A comprehensive review of systems was negative.   Objective:    BP 123/72   Pulse 84   Ht 5\' 7"  (1.702 m)   Wt 164 lb (74.4 kg)   BMI 25.69 kg/m  General appearance: alert, cooperative and appears stated age Head: Normocephalic, without  obvious abnormality, atraumatic Eyes: conj clear, EOMI, PEERLA Ears: normal TM's and external ear canals both ears Nose: Nares normal. Septum midline. Mucosa normal. No drainage or sinus tenderness. Throat: lips, mucosa, and tongue normal; teeth and gums normal Neck: no adenopathy, no carotid bruit, no JVD, supple, symmetrical, trachea midline and thyroid not enlarged, symmetric, no tenderness/mass/nodules Back: symmetric, no curvature. ROM normal. No CVA tenderness. Lungs: clear to auscultation bilaterally Breasts: horizontal incisions bilaterally well healed.  implants in place. no palpable lumps Heart: regular rate and rhythm, S1, S2 normal, no murmur, click, rub or gallop Abdomen: soft, non-tender; bowel sounds normal; no masses,  no organomegaly Pelvic: external genitalia normal, no adnexal masses or tenderness, rectovaginal septum normal, vagina normal without discharge and unable to find a cervical remnant Extremities: extremities normal, atraumatic, no cyanosis or edema Pulses: 2+ and symmetric Skin: Skin color, texture, turgor normal. No rashes or lesions Lymph nodes: Cervical, supraclavicular, and axillary nodes normal. Neurologic: Alert and oriented X 3, normal strength and tone. Normal symmetric reflexes. Normal coordination and gait    Assessment:    Healthy female exam.     Plan:     See After Visit Summary for Counseling Recommendations   Keep up a regular exercise program and make sure you are eating a healthy diet Try to eat 4 servings of dairy a day, or if you are lactose intolerant take a calcium  with vitamin D daily.  Your vaccines are up to date.    LE Weakness and balance issues- refer for PT. She will call in places in [place].    Memory issues - need notes from her psychiatrist and her Neurologist.

## 2016-01-24 NOTE — Patient Instructions (Signed)
Can use Debrox drop in the right ear. Use weekly for one month.  Can come in for nurse check in one month on your ear.   Keep up a regular exercise program and make sure you are eating a healthy diet Try to eat 4 servings of dairy a day, or if you are lactose intolerant take a calcium with vitamin D daily.  Your vaccines are up to date.

## 2016-01-24 NOTE — Telephone Encounter (Signed)
Call Dr. Jethro Bolus office and see who she recommend in [place] or [place] for a retina specialist.  Please let KiKi know name, phone and address to schedule her appt.

## 2016-01-27 DIAGNOSIS — F322 Major depressive disorder, single episode, severe without psychotic features: Secondary | ICD-10-CM | POA: Diagnosis not present

## 2016-01-28 LAB — CYTOLOGY - PAP

## 2016-01-28 NOTE — Telephone Encounter (Signed)
Called Dr. Jacelyn Grip ofc and was informed that they would need some additional information to be able to assist with what Dr. Madilyn Fireman was wanting. I informed Alyse Low that the pt has a hx of bilateral cataract extraction, retinal repair.     Spoke w/pt's husband and he gave me her former eye specialist # [phone] Dr. Everlene Farrier I called their office and spoke w/monica and asked for her last OV to be faxed to our office. I also called Dr. Darden Dates office [phone] to get her last OV faxed.Audelia [place]

## 2016-02-05 ENCOUNTER — Other Ambulatory Visit: Payer: Self-pay | Admitting: Family Medicine

## 2016-02-05 ENCOUNTER — Telehealth: Payer: Self-pay | Admitting: Family Medicine

## 2016-02-05 NOTE — Telephone Encounter (Signed)
Pt husband called clinic to see if PCP had any recommendations regarding the chronic productive cough Pt has had since her cardiac bypass 4 years. Will route to PCP for review.

## 2016-02-06 DIAGNOSIS — E1065 Type 1 diabetes mellitus with hyperglycemia: Secondary | ICD-10-CM | POA: Diagnosis not present

## 2016-02-06 NOTE — Telephone Encounter (Signed)
Left VM for husband advising appt would be needed. Callback information provided.

## 2016-02-06 NOTE — Telephone Encounter (Signed)
There are many diff causes of chronic cough. She is on a PPI so likely not reflux.  It can be from post nasal drip and from chronic throat irritation. Lets discuss at next OV.

## 2016-02-07 DIAGNOSIS — F322 Major depressive disorder, single episode, severe without psychotic features: Secondary | ICD-10-CM | POA: Diagnosis not present

## 2016-02-14 ENCOUNTER — Ambulatory Visit (INDEPENDENT_AMBULATORY_CARE_PROVIDER_SITE_OTHER): Payer: BLUE CROSS/BLUE SHIELD | Admitting: Family Medicine

## 2016-02-14 ENCOUNTER — Encounter: Payer: Self-pay | Admitting: Family Medicine

## 2016-02-14 ENCOUNTER — Telehealth: Payer: Self-pay | Admitting: Family Medicine

## 2016-02-14 VITALS — BP 95/60 | HR 98 | Ht 67.0 in | Wt 161.0 lb

## 2016-02-14 DIAGNOSIS — R053 Chronic cough: Secondary | ICD-10-CM

## 2016-02-14 DIAGNOSIS — R05 Cough: Secondary | ICD-10-CM

## 2016-02-14 DIAGNOSIS — Z23 Encounter for immunization: Secondary | ICD-10-CM

## 2016-02-14 MED ORDER — OMEPRAZOLE 40 MG PO CPDR: 40.0000 mg | DELAYED_RELEASE_CAPSULE | Freq: Two times a day (BID) | ORAL | 0 refills | Status: DC

## 2016-02-14 NOTE — Progress Notes (Signed)
Subjective:    CC: Cough, Ear Pain   HPI: Chronic cough - says it does produce mucous.  It's been going on for years. She does take omeprazole daily but takes it with her breakfast and her other medications. She says she still gets breakthrough reflux symptoms sometimes. She also has chronic postnasal drip. She says her voice gets raspy and hoarse at times. No sore throat. She says she also does a lot of throat clearing. No worsening or alleviatng factors.  No respiratory problems.    Recheck right ear - she had impaction and has been using Debrox. Here for recheck.    Past medical history, Surgical history, Family history not pertinant except as noted below, Social history, Allergies, and medications have been entered into the medical record, reviewed, and corrections made.   Review of Systems: No fevers, chills, night sweats, weight loss, chest pain, or shortness of breath.   Objective:    General: Well Developed, well nourished, and in no acute distress.  Neuro: Alert and oriented x3, extra-ocular muscles intact, sensation grossly intact.  HEENT: Normocephalic, atraumatic  Skin: Warm and dry, no rashes. Cardiac: Regular rate and rhythm, no murmurs rubs or gallops, no lower extremity edema.  Respiratory: Clear to auscultation bilaterally. Not using accessory muscles, speaking in full sentences.   Impression and Recommendations:    Chronic cough - discussed the reflux elicitable so drip are the most common causes. We discussed better controlling her reflux by taking her omeprazole once a day 30 minutes before her first meal a day and have been adding an evening dose as well. She'll take this for 3 weeks and then let me know if she is improving or not. We also reviewed reflux dietary measure.    Right ear cerumen impaction- irrigation performed today. Tolerated well.

## 2016-02-14 NOTE — Telephone Encounter (Signed)
I called Alejandra Valdez today regarding her referral for PT she stated she was coming in to see Dr. Madilyn Fireman today and would discuss the referral with her. - CF

## 2016-02-14 NOTE — Patient Instructions (Addendum)
Taking her omeprazole about 30 minutes before your first meal of the day on an empty stomach. This will help turn off the acid pumps before you eat and take your medicine. Take a second tab around bedtime. Try this for 3 weeks and see if it helps with her throat clearing and cough. If it does help and continue the medication for about 6-8 weeks. After that try to drop back down to once a day about 30 minutes before breakfast. If your symptoms do not improve after 3 weeks and please call me back and we will likely refer you to an ear nose and throat specialist.  Food Choices for Gastroesophageal Reflux Disease, Adult When you have gastroesophageal reflux disease (GERD), the foods you eat and your eating habits are very important. Choosing the right foods can help ease the discomfort of GERD. WHAT GENERAL GUIDELINES DO I NEED TO FOLLOW?  Choose fruits, vegetables, whole grains, low-fat dairy products, and low-fat meat, fish, and poultry.  Limit fats such as oils, salad dressings, butter, nuts, and avocado.  Keep a food diary to identify foods that cause symptoms.  Avoid foods that cause reflux. These may be different for different people.  Eat frequent small meals instead of three large meals each day.  Eat your meals slowly, in a relaxed setting.  Limit fried foods.  Cook foods using methods other than frying.  Avoid drinking alcohol.  Avoid drinking large amounts of liquids with your meals.  Avoid bending over or lying down until 2-3 hours after eating. WHAT FOODS ARE NOT RECOMMENDED? The following are some foods and drinks that may worsen your symptoms: Vegetables Tomatoes. Tomato juice. Tomato and spaghetti sauce. Chili peppers. Onion and garlic. Horseradish. Fruits Oranges, grapefruit, and lemon (fruit and juice). Meats High-fat meats, fish, and poultry. This includes hot dogs, ribs, ham, sausage, salami, and bacon. Dairy Whole milk and chocolate milk. Sour cream. Cream.  Butter. Ice cream. Cream cheese.  Beverages Coffee and tea, with or without caffeine. Carbonated beverages or energy drinks. Condiments Hot sauce. Barbecue sauce.  Sweets/Desserts Chocolate and cocoa. Donuts. Peppermint and spearmint. Fats and Oils High-fat foods, including [place] fries and potato chips. Other Vinegar. Strong spices, such as black pepper, white pepper, red pepper, cayenne, curry powder, cloves, ginger, and chili powder. The items listed above may not be a complete list of foods and beverages to avoid. Contact your dietitian for more information.   This information is not intended to replace advice given to you by your health care provider. Make sure you discuss any questions you have with your health care provider.   Document Released: 06/15/2005 Document Revised: 07/06/2014 Document Reviewed: 04/19/2013 Elsevier Interactive Patient Education [employer].

## 2016-02-21 ENCOUNTER — Encounter: Payer: Self-pay | Admitting: Family Medicine

## 2016-02-26 DIAGNOSIS — F322 Major depressive disorder, single episode, severe without psychotic features: Secondary | ICD-10-CM | POA: Diagnosis not present

## 2016-02-27 DIAGNOSIS — L298 Other pruritus: Secondary | ICD-10-CM | POA: Diagnosis not present

## 2016-02-27 DIAGNOSIS — L821 Other seborrheic keratosis: Secondary | ICD-10-CM | POA: Diagnosis not present

## 2016-02-27 DIAGNOSIS — L820 Inflamed seborrheic keratosis: Secondary | ICD-10-CM | POA: Diagnosis not present

## 2016-02-27 DIAGNOSIS — L710 Perioral dermatitis: Secondary | ICD-10-CM | POA: Diagnosis not present

## 2016-02-27 DIAGNOSIS — L538 Other specified erythematous conditions: Secondary | ICD-10-CM | POA: Diagnosis not present

## 2016-03-05 ENCOUNTER — Telehealth: Payer: Self-pay

## 2016-03-05 DIAGNOSIS — R053 Chronic cough: Secondary | ICD-10-CM

## 2016-03-05 DIAGNOSIS — R05 Cough: Secondary | ICD-10-CM

## 2016-03-05 DIAGNOSIS — Z951 Presence of aortocoronary bypass graft: Secondary | ICD-10-CM

## 2016-03-05 NOTE — Telephone Encounter (Signed)
Alejandra Valdez called and stated she needs a referral to ENT for her cough. Do you want me to refer her to ENT for cough? She also wanted a referral to Cardiology and will go to a [facility] provider Dr Madilyn Fireman recommends. What would be her diagnosis? She would like to go to [facility] in [place] for PT.   I have taken care of the PT referral.   [facility] [place], [place] [address] Phone: [phone] Fax: [phone]

## 2016-03-05 NOTE — Telephone Encounter (Signed)
All referrals ordered.

## 2016-03-05 NOTE — Telephone Encounter (Signed)
We can set her up with Kirk Ruths downstairs for cardiology. For the chronic cough we can refer her to [facility] here in town or something closer to [facility] if she would prefer.

## 2016-03-06 DIAGNOSIS — F322 Major depressive disorder, single episode, severe without psychotic features: Secondary | ICD-10-CM | POA: Diagnosis not present

## 2016-03-09 DIAGNOSIS — E10319 Type 1 diabetes mellitus with unspecified diabetic retinopathy without macular edema: Secondary | ICD-10-CM | POA: Diagnosis not present

## 2016-03-09 DIAGNOSIS — I2510 Atherosclerotic heart disease of native coronary artery without angina pectoris: Secondary | ICD-10-CM | POA: Diagnosis not present

## 2016-03-09 DIAGNOSIS — M810 Age-related osteoporosis without current pathological fracture: Secondary | ICD-10-CM | POA: Diagnosis not present

## 2016-03-09 DIAGNOSIS — E039 Hypothyroidism, unspecified: Secondary | ICD-10-CM | POA: Diagnosis not present

## 2016-03-09 DIAGNOSIS — E1065 Type 1 diabetes mellitus with hyperglycemia: Secondary | ICD-10-CM | POA: Diagnosis not present

## 2016-03-10 NOTE — Telephone Encounter (Signed)
Did we call her back with this?

## 2016-03-15 ENCOUNTER — Other Ambulatory Visit: Payer: Self-pay | Admitting: Family Medicine

## 2016-03-16 ENCOUNTER — Other Ambulatory Visit: Payer: Self-pay | Admitting: Family Medicine

## 2016-03-23 DIAGNOSIS — H9190 Unspecified hearing loss, unspecified ear: Secondary | ICD-10-CM | POA: Diagnosis not present

## 2016-03-23 DIAGNOSIS — R0982 Postnasal drip: Secondary | ICD-10-CM | POA: Diagnosis not present

## 2016-03-30 NOTE — Telephone Encounter (Signed)
Yes this information has been faxed and is in her chart.Alejandra Valdez

## 2016-04-07 ENCOUNTER — Other Ambulatory Visit: Payer: Self-pay | Admitting: Family Medicine

## 2016-04-07 DIAGNOSIS — L03031 Cellulitis of right toe: Secondary | ICD-10-CM | POA: Diagnosis not present

## 2016-04-07 DIAGNOSIS — L03032 Cellulitis of left toe: Secondary | ICD-10-CM | POA: Diagnosis not present

## 2016-04-07 NOTE — Progress Notes (Signed)
HPI: 60 yo female for evaluation of CAD. Patient suffered a myocardial infarction in [place] in 2013. She then had CABG 2013 with LIMA to LAD, SVG to RCA and SVG to OM1 and D2. Was followed then in [place] until approximately one year ago when she moved to this area to be close to family. Echo at [facility] 6/17 showed anteroseptal and apical hypokinesis with overall preserved LV function; diastolic dysfunction; trace MR and TR; mildly reduced RV function. Patient denies dyspnea on exertion, orthopnea, PND or pedal edema. She does not have chest pain. She does have occasional back pain that can occur both with exertion and at rest. She does state that she had back pain at the time of her previous infarct. Her back pain has been present intermittently since bypass.  Current Outpatient Prescriptions  Medication Sig Dispense Refill  . BAYER CONTOUR NEXT TEST test strip 150 each as needed.  3  . celecoxib (CELEBREX) 200 MG capsule Take 1 capsule (200 mg total) by mouth daily. *NO ADDITIONAL REFILLS. PLEASE CALL THE OFFICE TO SCHEDULE AN APPOINTMENT* 30 capsule 0  . clonazePAM (KLONOPIN) 1 MG tablet Take 2 mg by mouth daily as needed.   0  . cycloSPORINE (RESTASIS) 0.05 % ophthalmic emulsion Place 1 drop into both eyes 2 (two) times daily. 1 each 1  . DULoxetine (CYMBALTA) 60 MG capsule TAKE 1 CAPSULE(60 MG) BY MOUTH DAILY 30 capsule 2  . ezetimibe (ZETIA) 10 MG tablet Take 1 tablet (10 mg total) by mouth daily. 30 tablet 1  . ibandronate (BONIVA) 150 MG tablet Take 1 tablet (150 mg total) by mouth every 30 (thirty) days. 3 tablet 3  . insulin aspart (NOVOLOG) 100 UNIT/ML injection Inject into the skin as needed for high blood sugar.    . lamoTRIgine (LAMICTAL) 100 MG tablet Take 100 mg by mouth daily.    Marland Kitchen LATUDA 20 MG TABS tablet TAKE 1 TABLET(20 MG) BY MOUTH AT BEDTIME 30 tablet 0  . levothyroxine (SYNTHROID, LEVOTHROID) 137 MCG tablet TAKE 1 TABLET BY MOUTH DAILY BEFORE BREAKFAST 30 tablet  0  . levothyroxine (SYNTHROID, LEVOTHROID) 137 MCG tablet Take 1 tablet (137 mcg total) by mouth daily before breakfast. NEED LAB WORK 15 tablet 0  . Liraglutide (VICTOZA) 18 MG/3ML SOPN Inject 3 mLs into the skin 3 (three) times daily.    . metoprolol succinate (TOPROL-XL) 25 MG 24 hr tablet Take 1 tablet (25 mg total) by mouth daily. *NO ADDITIONAL REFILLS. PLEASE CALL THE OFFICE TO SCHEDULE AN APPOINTMENT* 30 tablet 0  . naratriptan (AMERGE) 2.5 MG tablet Take 2.5 mg by mouth. Take one tablet at onset of migraine do not take more than 1 a day  0  . nortriptyline (PAMELOR) 10 MG capsule TAKE 1 CAPSULE(10 MG) BY MOUTH THREE TIMES DAILY. FOLLOW UP APPOINTMENT BEFORE MORE REFILLS (Patient taking differently: TAKE 1 CAPSULE(10 MG) BY MOUTH at bedtime) 30 capsule 0  . omeprazole (PRILOSEC) 40 MG capsule Take 1 capsule (40 mg total) by mouth 2 (two) times daily. 180 capsule 0  . Prenat Vit-Fe Gly Cys-FA-Omega (ENBRACE HR) CAPS Take 1 capsule by mouth daily. 90 capsule 3  . PRISTIQ 50 MG 24 hr tablet TAKE 1 TABLET BY MOUTH EVERY DAY 30 tablet 5  . simvastatin (ZOCOR) 40 MG tablet Take 1 tablet (40 mg total) by mouth daily at 6 PM. 30 tablet 6  . Sulfacetamide Sodium-Sulfur (AVAR LS CLEANSER EX) Apply topically.    . SUMAtriptan  Succinate (ONZETRA XSAIL) 11 MG/NOSEPC EXHP Place into the nose.    . temazepam (RESTORIL) 15 MG capsule TK ONE C PO QHS  1  . VIIBRYD 40 MG TABS   0   No current facility-administered medications for this visit.     Allergies  Allergen Reactions  . Vicodin [Hydrocodone-Acetaminophen]      Past Medical History:  Diagnosis Date  . ADHD (attention deficit hyperactivity disorder)   . Anxiety   . Arthritis    Of spine DDD/DJD  . Cancer [facility]) 2012   Breast  . Depression   . Diabetes mellitus without complication ([place])   . H/O bilateral breast implants 01/24/2016  . History of colon polyps   . Hypothyroid   . Lung nodule    Stable per CT 05/2013  . Migraine   .  Myocardial infarction 2013  . Neuropathy ([place])   . Osteoporosis     Past Surgical History:  Procedure Laterality Date  . ABDOMINAL HYSTERECTOMY     has left ovary  . APPENDECTOMY    . BILATERAL CARPAL TUNNEL RELEASE    . BLEPHAROPLASTY Bilateral   . CATARACT EXTRACTION Bilateral   . CESAREAN SECTION     x2  . CORONARY ARTERY BYPASS GRAFT  2013   x4  . MASTECTOMY Bilateral 2012  . OTHER SURGICAL HISTORY     Retinal repair  . SHOULDER SURGERY Bilateral     Social History   Social History  . Marital status: Married    Spouse name: bobby   . Number of children: 1  . Years of education: N/A   Occupational History  . Disabled    Social History Main Topics  . Smoking status: Never Smoker  . Smokeless tobacco: Not on file  . Alcohol use No  . Drug use: No  . Sexual activity: No   Other Topics Concern  . Not on file   Social History Narrative   No daily caffeine intake. Walks for 30 minutes for exercise. She is currently disabled.    Family History  Problem Relation Age of Onset  . Depression Mother   . Alcohol abuse Father   . Hyperlipidemia Father   . Hypertension Father   . Stroke Father   . Alcohol abuse Brother   . Depression Brother   . Hyperlipidemia Brother   . Hypertension Brother   . Cancer Maternal Grandmother   . Heart attack Maternal Grandmother   . Diabetes Maternal Grandfather   . Alcohol abuse Brother   . Depression Brother   . Hyperlipidemia Brother   . Hypertension Brother     ROS: Complains of mucus production but no fevers or chills, hemoptysis, dysphasia, odynophagia, melena, hematochezia, dysuria, hematuria, rash, seizure activity, orthopnea, PND, pedal edema, claudication. Remaining systems are negative.  Physical Exam:   Blood pressure 122/78, height 5\' 7"  (1.702 m), weight 166 lb 1.9 oz (75.4 kg).  General:  Well developed/well nourished in NAD Skin warm/dry Patient not depressed No peripheral  clubbing Back-normal HEENT-normal/normal eyelids Neck supple/normal carotid upstroke bilaterally; no bruits; no JVD; no thyromegaly chest - CTA/ normal expansion previous sternotomy bilateral breast implants,  CV - RRR/normal S1 and S2; no murmurs, rubs or gallops;  PMI nondisplaced Abdomen -NT/ND, no HSM, no mass, + bowel sounds, no bruit 2+ femoral pulses, no bruits Ext-no edema, chords, 2+ DP Neuro-grossly nonfocal  ECG -Sinus rhythm at a rate of 88. RV conduction delay. Nonspecific ST changes.  A/P  1 coronary artery disease-continue  aspirin and statin.   2 hyperlipidemia-continue Zocor and Zetia. Check lipids and liver. We may transition to high-dose Crestor or Lipitor pending results.  3 chest pain-patient does have some back pain which was her symptoms prior to bypass surgery. They are atypical. Plan Lexiscan nuclear study for risk stratification.  Kirk Ruths, MD

## 2016-04-08 ENCOUNTER — Ambulatory Visit (INDEPENDENT_AMBULATORY_CARE_PROVIDER_SITE_OTHER): Payer: BLUE CROSS/BLUE SHIELD | Admitting: Cardiology

## 2016-04-08 ENCOUNTER — Encounter: Payer: Self-pay | Admitting: Cardiology

## 2016-04-08 VITALS — BP 122/78 | Ht 67.0 in | Wt 166.1 lb

## 2016-04-08 DIAGNOSIS — E784 Other hyperlipidemia: Secondary | ICD-10-CM | POA: Diagnosis not present

## 2016-04-08 DIAGNOSIS — E7849 Other hyperlipidemia: Secondary | ICD-10-CM

## 2016-04-08 DIAGNOSIS — I208 Other forms of angina pectoris: Secondary | ICD-10-CM | POA: Diagnosis not present

## 2016-04-08 DIAGNOSIS — I25810 Atherosclerosis of coronary artery bypass graft(s) without angina pectoris: Secondary | ICD-10-CM | POA: Diagnosis not present

## 2016-04-08 NOTE — Patient Instructions (Signed)
Medication Instructions:   NO CHANGE  Labwork:  Your physician recommends that you HAVE LAB WORK TODAY  Testing/Procedures:  Your physician has requested that you have a lexiscan myoview. For further information please visit [email or web address]. Please follow instruction sheet, as given.    Follow-Up:  Your physician wants you to follow-up in: 6 MONTHS WITH DR CRENSHAW You will receive a reminder letter in the mail two months in advance. If you don't receive a letter, please call our office to schedule the follow-up appointment.   If you need a refill on your cardiac medications before your next appointment, please call your pharmacy.    

## 2016-04-09 DIAGNOSIS — L710 Perioral dermatitis: Secondary | ICD-10-CM | POA: Diagnosis not present

## 2016-04-09 DIAGNOSIS — L820 Inflamed seborrheic keratosis: Secondary | ICD-10-CM | POA: Diagnosis not present

## 2016-04-15 ENCOUNTER — Telehealth: Payer: Self-pay | Admitting: *Deleted

## 2016-04-15 DIAGNOSIS — R1013 Epigastric pain: Secondary | ICD-10-CM

## 2016-04-15 NOTE — Telephone Encounter (Signed)
Pt called asking for the phone # for the ENT -[phone]. Also she wanted a referral for GI. She stated that she has been having some issues belching and stomach pain for some time. she reports that she has been using probiotics and this has helped her with the discomfort but feels that she needs to be seen. Referral placed.Alejandra Valdez [place]

## 2016-04-19 ENCOUNTER — Other Ambulatory Visit: Payer: Self-pay | Admitting: Family Medicine

## 2016-04-20 DIAGNOSIS — R2689 Other abnormalities of gait and mobility: Secondary | ICD-10-CM | POA: Diagnosis not present

## 2016-04-20 DIAGNOSIS — R296 Repeated falls: Secondary | ICD-10-CM | POA: Diagnosis not present

## 2016-04-20 DIAGNOSIS — M6281 Muscle weakness (generalized): Secondary | ICD-10-CM | POA: Diagnosis not present

## 2016-04-27 ENCOUNTER — Encounter (HOSPITAL_COMMUNITY): Payer: BLUE CROSS/BLUE SHIELD

## 2016-04-28 DIAGNOSIS — R0982 Postnasal drip: Secondary | ICD-10-CM | POA: Diagnosis not present

## 2016-04-28 DIAGNOSIS — H903 Sensorineural hearing loss, bilateral: Secondary | ICD-10-CM | POA: Diagnosis not present

## 2016-05-01 DIAGNOSIS — E109 Type 1 diabetes mellitus without complications: Secondary | ICD-10-CM | POA: Diagnosis not present

## 2016-05-06 ENCOUNTER — Telehealth (HOSPITAL_COMMUNITY): Payer: Self-pay | Admitting: *Deleted

## 2016-05-06 NOTE — Telephone Encounter (Signed)
Patient given detailed instructions per Myocardial Perfusion Study Information Sheet for the test on 05/11/16. Patient notified to arrive 15 minutes early and that it is imperative to arrive on time for appointment to keep from having the test rescheduled.  If you need to cancel or reschedule your appointment, please call the office within 24 hours of your appointment. Failure to do so may result in a cancellation of your appointment, and a $50 no show fee. Patient verbalized understanding.  Hubbard Robinson, RN

## 2016-05-11 ENCOUNTER — Ambulatory Visit (HOSPITAL_COMMUNITY): Payer: BLUE CROSS/BLUE SHIELD | Attending: Cardiology

## 2016-05-11 DIAGNOSIS — R0602 Shortness of breath: Secondary | ICD-10-CM | POA: Insufficient documentation

## 2016-05-11 DIAGNOSIS — R079 Chest pain, unspecified: Secondary | ICD-10-CM | POA: Insufficient documentation

## 2016-05-11 DIAGNOSIS — Z8249 Family history of ischemic heart disease and other diseases of the circulatory system: Secondary | ICD-10-CM | POA: Diagnosis not present

## 2016-05-11 DIAGNOSIS — I10 Essential (primary) hypertension: Secondary | ICD-10-CM | POA: Insufficient documentation

## 2016-05-11 DIAGNOSIS — E118 Type 2 diabetes mellitus with unspecified complications: Secondary | ICD-10-CM | POA: Diagnosis not present

## 2016-05-11 DIAGNOSIS — E1040 Type 1 diabetes mellitus with diabetic neuropathy, unspecified: Secondary | ICD-10-CM | POA: Diagnosis not present

## 2016-05-11 DIAGNOSIS — R2689 Other abnormalities of gait and mobility: Secondary | ICD-10-CM | POA: Diagnosis not present

## 2016-05-11 DIAGNOSIS — R296 Repeated falls: Secondary | ICD-10-CM | POA: Diagnosis not present

## 2016-05-11 DIAGNOSIS — I252 Old myocardial infarction: Secondary | ICD-10-CM | POA: Diagnosis not present

## 2016-05-11 DIAGNOSIS — I25810 Atherosclerosis of coronary artery bypass graft(s) without angina pectoris: Secondary | ICD-10-CM

## 2016-05-11 DIAGNOSIS — R531 Weakness: Secondary | ICD-10-CM | POA: Diagnosis not present

## 2016-05-11 LAB — MYOCARDIAL PERFUSION IMAGING
CHL CUP NUCLEAR SDS: 1
CHL CUP NUCLEAR SRS: 0
CHL CUP NUCLEAR SSS: 1
CHL CUP RESTING HR STRESS: 83 {beats}/min
CSEPPHR: 93 {beats}/min
LV dias vol: 64 mL (ref 46–106)
LV sys vol: 28 mL
RATE: 0.3
TID: 1.26

## 2016-05-11 MED ORDER — TECHNETIUM TC 99M TETROFOSMIN IV KIT
10.7000 | PACK | Freq: Once | INTRAVENOUS | Status: AC | PRN
  Administered 2016-05-11: 10.7 via INTRAVENOUS
  Filled 2016-05-11: qty 11

## 2016-05-11 MED ORDER — REGADENOSON 0.4 MG/5ML IV SOLN
0.4000 mg | Freq: Once | INTRAVENOUS | Status: AC
  Administered 2016-05-11: 0.4 mg via INTRAVENOUS

## 2016-05-11 MED ORDER — TECHNETIUM TC 99M TETROFOSMIN IV KIT
31.7000 | PACK | Freq: Once | INTRAVENOUS | Status: AC | PRN
  Administered 2016-05-11: 31.7 via INTRAVENOUS
  Filled 2016-05-11: qty 32

## 2016-05-13 DIAGNOSIS — E109 Type 1 diabetes mellitus without complications: Secondary | ICD-10-CM | POA: Diagnosis not present

## 2016-05-14 ENCOUNTER — Telehealth: Payer: Self-pay | Admitting: Cardiology

## 2016-05-14 NOTE — Telephone Encounter (Signed)
New message    Patient calling -need  confirmation  patient had appt on Monday 11.13.2017 @ 10 am for nuclear stress @ [address] office    Patient is receive  ticket $ 250.00 penalty - patient is appealing the ticket   [place] is asking for this information - traffic enforcement- Helmut Muster   Fax # [phone]  Phone # [phone]

## 2016-05-14 NOTE — Telephone Encounter (Signed)
Pt says she is having some side effects from the stress test.Please call to advise

## 2016-05-14 NOTE — Telephone Encounter (Signed)
Spoke with pt, she reports about an hour after her nuclear study she went to physical therapy and had to sit down because she was shaky and off balance. She reports nausea since the stress test and has not been able to eat or drink anything. She also reports blurred vision. She feels this is related to the stress test. Encouraged patient to increase fluids as she is able because she will continue to have some problems until she rehydrates.she continues to take her blood sugar medication even though she has not eaten. Reports her sugars have been fine when checked. Nuclear medicine department made aware. Pt to call back with concerns.

## 2016-05-14 NOTE — Telephone Encounter (Signed)
Reviewed chart. Confirmed that the patient did in fact have a stress test on 05/11/16. I have attempted to reach the phone number provided, which is "disconnected or no longer in service". I have sent a note to fax number.

## 2016-05-18 ENCOUNTER — Other Ambulatory Visit: Payer: Self-pay | Admitting: Family Medicine

## 2016-05-22 DIAGNOSIS — E109 Type 1 diabetes mellitus without complications: Secondary | ICD-10-CM | POA: Diagnosis not present

## 2016-05-24 ENCOUNTER — Other Ambulatory Visit: Payer: Self-pay | Admitting: Family Medicine

## 2016-05-25 DIAGNOSIS — R112 Nausea with vomiting, unspecified: Secondary | ICD-10-CM | POA: Diagnosis not present

## 2016-05-25 DIAGNOSIS — K5900 Constipation, unspecified: Secondary | ICD-10-CM | POA: Diagnosis not present

## 2016-05-25 DIAGNOSIS — R12 Heartburn: Secondary | ICD-10-CM | POA: Diagnosis not present

## 2016-05-28 ENCOUNTER — Encounter: Payer: Self-pay | Admitting: Sports Medicine

## 2016-05-28 ENCOUNTER — Ambulatory Visit (INDEPENDENT_AMBULATORY_CARE_PROVIDER_SITE_OTHER): Payer: BLUE CROSS/BLUE SHIELD

## 2016-05-28 ENCOUNTER — Other Ambulatory Visit: Payer: Self-pay | Admitting: Family Medicine

## 2016-05-28 ENCOUNTER — Ambulatory Visit (INDEPENDENT_AMBULATORY_CARE_PROVIDER_SITE_OTHER): Payer: BLUE CROSS/BLUE SHIELD | Admitting: Sports Medicine

## 2016-05-28 DIAGNOSIS — E1059 Type 1 diabetes mellitus with other circulatory complications: Secondary | ICD-10-CM | POA: Diagnosis not present

## 2016-05-28 DIAGNOSIS — I739 Peripheral vascular disease, unspecified: Secondary | ICD-10-CM | POA: Diagnosis not present

## 2016-05-28 DIAGNOSIS — M85861 Other specified disorders of bone density and structure, right lower leg: Secondary | ICD-10-CM

## 2016-05-28 DIAGNOSIS — M7061 Trochanteric bursitis, right hip: Secondary | ICD-10-CM

## 2016-05-28 DIAGNOSIS — M1611 Unilateral primary osteoarthritis, right hip: Secondary | ICD-10-CM | POA: Diagnosis not present

## 2016-05-28 DIAGNOSIS — M1712 Unilateral primary osteoarthritis, left knee: Secondary | ICD-10-CM | POA: Diagnosis not present

## 2016-05-28 DIAGNOSIS — M179 Osteoarthritis of knee, unspecified: Secondary | ICD-10-CM | POA: Diagnosis not present

## 2016-05-28 DIAGNOSIS — I25810 Atherosclerosis of coronary artery bypass graft(s) without angina pectoris: Secondary | ICD-10-CM | POA: Diagnosis not present

## 2016-05-28 NOTE — Assessment & Plan Note (Signed)
X-rays, injection.

## 2016-05-28 NOTE — Progress Notes (Signed)
Place order for renal function

## 2016-05-28 NOTE — Assessment & Plan Note (Signed)
Injection as above, return in one month, x-rays.

## 2016-05-28 NOTE — Progress Notes (Signed)
Subjective:    I'm seeing this patient as a consultation for:  Dr. Beatrice Lecher  CC: Right hip pain, left knee pain  HPI:  Right hip pain: Present for months, localized over the greater trochanter, desires injection today. She tells me she is already doing physical therapy, pain is moderate, persistent, localized without radiation.  Left knee pain: Localized to the medial joint line, moderate, persistent without radiation.   Past medical history:  Negative.  See flowsheet/record as well for more information.  Surgical history: Negative.  See flowsheet/record as well for more information.  Family history: Negative.  See flowsheet/record as well for more information.  Social history: Negative.  See flowsheet/record as well for more information.  Allergies, and medications have been entered into the medical record, reviewed, and no changes needed.   Review of Systems: No headache, visual changes, nausea, vomiting, diarrhea, constipation, dizziness, abdominal pain, skin rash, fevers, chills, night sweats, weight loss, swollen lymph nodes, body aches, joint swelling, muscle aches, chest pain, shortness of breath, mood changes, visual or auditory hallucinations.   Objective:   General: Well Developed, well nourished, and in no acute distress.  Neuro/Psych: Alert and oriented x3, extra-ocular muscles intact, able to move all 4 extremities, sensation grossly intact. Skin: Warm and dry, no rashes noted.  Respiratory: Not using accessory muscles, speaking in full sentences, trachea midline.  Cardiovascular: Pulses palpable, no extremity edema. Abdomen: Does not appear distended. Right Hip: ROM IR: 60 Deg, ER: 60 Deg, Flexion: 120 Deg, Extension: 100 Deg, Abduction: 45 Deg, Adduction: 45 Deg Strength IR: 5/5, ER: 5/5, Flexion: 5/5, Extension: 5/5, Abduction: 5/5, Adduction: 5/5 Pelvic alignment unremarkable to inspection and palpation. Standing hip rotation and gait without  trendelenburg / unsteadiness. Greater trochanter with tenderness to palpation. No tenderness over piriformis. No SI joint tenderness and normal minimal SI movement. Left Knee: Normal to inspection with no erythema or effusion or obvious bony abnormalities. Moderate tenderness at the medial joint line, no effusion. ROM normal in flexion and extension and lower leg rotation. Ligaments with solid consistent endpoints including ACL, PCL, LCL, MCL. Negative Mcmurray's and provocative meniscal tests. Non painful patellar compression. Patellar and quadriceps tendons unremarkable. Hamstring and quadriceps strength is normal.  Procedure: Real-time Ultrasound Guided Injection of right greater trochanteric bursa Device: GE Logiq E  Verbal informed consent obtained.  Time-out conducted.  Noted no overlying erythema, induration, or other signs of local infection.  Skin prepped in a sterile fashion.  Local anesthesia: Topical Ethyl chloride.  With sterile technique and under real time ultrasound guidance:  Using a 22-gauge spinal needle advanced through the hip abductor tendons, to the greater trochanter, contacted bone, I then injected 1 mL kenalog 40, 2 mL lidocaine, 2 mL Marcaine Completed without difficulty  Pain immediately resolved suggesting accurate placement of the medication.  Advised to call if fevers/chills, erythema, induration, drainage, or persistent bleeding.  Images permanently stored and available for review in the ultrasound unit.  Impression: Technically successful ultrasound guided injection.  Procedure: Real-time Ultrasound Guided Injection of left knee Device: GE Logiq E  Verbal informed consent obtained.  Time-out conducted.  Noted no overlying erythema, induration, or other signs of local infection.  Skin prepped in a sterile fashion.  Local anesthesia: Topical Ethyl chloride.  With sterile technique and under real time ultrasound guidance:  Using a 25-gauge needle  advanced into the suprapatellar recess and injected 1 mL kenalog 40, 2 mL lidocaine, 2 mL Marcaine. Completed without difficulty  Pain immediately resolved  suggesting accurate placement of the medication.  Advised to call if fevers/chills, erythema, induration, drainage, or persistent bleeding.  Images permanently stored and available for review in the ultrasound unit.  Impression: Technically successful ultrasound guided injection.  Impression and Recommendations:   This case required medical decision making of moderate complexity.  Greater trochanteric bursitis, right Injection as above, return in one month, x-rays.  Primary osteoarthritis of left knee X-rays, injection.

## 2016-05-29 ENCOUNTER — Encounter: Payer: Self-pay | Admitting: *Deleted

## 2016-05-29 LAB — BASIC METABOLIC PANEL WITH GFR
BUN: 12 mg/dL (ref 7–25)
CO2: 21 mmol/L (ref 20–31)
Calcium: 9 mg/dL (ref 8.6–10.4)
Chloride: 109 mmol/L (ref 98–110)
Creat: 0.72 mg/dL (ref 0.50–1.05)
GFR, Est Non African American: >89 mL/min (ref >=60)
Glucose, Bld: 80 mg/dL (ref 65–99)
POTASSIUM: 4.3 mmol/L (ref 3.5–5.3)
Sodium: 142 mmol/L (ref 135–146)

## 2016-05-29 LAB — HEPATIC FUNCTION PANEL
ALBUMIN: 4 g/dL (ref 3.6–5.1)
ALK PHOS: 52 U/L (ref 33–130)
ALT: 17 U/L (ref 6–29)
AST: 25 U/L (ref 10–35)
BILIRUBIN INDIRECT: 0.4 mg/dL (ref 0.2–1.2)
BILIRUBIN TOTAL: 0.5 mg/dL (ref 0.2–1.2)
Bilirubin, Direct: 0.1 mg/dL (ref <=0.2)
Total Protein: 6 g/dL — ABNORMAL LOW (ref 6.1–8.1)

## 2016-05-29 LAB — LIPID PANEL
Cholesterol: 123 mg/dL (ref <200)
HDL: 53 mg/dL (ref >50)
LDL CALC: 54 mg/dL (ref <100)
Total CHOL/HDL Ratio: 2.3 Ratio (ref <5.0)
Triglycerides: 79 mg/dL (ref <150)
VLDL: 16 mg/dL (ref <30)

## 2016-05-29 NOTE — Progress Notes (Signed)
All labs are normal. 

## 2016-06-03 DIAGNOSIS — F322 Major depressive disorder, single episode, severe without psychotic features: Secondary | ICD-10-CM | POA: Diagnosis not present

## 2016-06-08 DIAGNOSIS — K449 Diaphragmatic hernia without obstruction or gangrene: Secondary | ICD-10-CM | POA: Diagnosis not present

## 2016-06-08 DIAGNOSIS — R112 Nausea with vomiting, unspecified: Secondary | ICD-10-CM | POA: Diagnosis not present

## 2016-06-08 DIAGNOSIS — R6881 Early satiety: Secondary | ICD-10-CM | POA: Diagnosis not present

## 2016-06-10 DIAGNOSIS — R29898 Other symptoms and signs involving the musculoskeletal system: Secondary | ICD-10-CM | POA: Diagnosis not present

## 2016-06-10 DIAGNOSIS — R296 Repeated falls: Secondary | ICD-10-CM | POA: Diagnosis not present

## 2016-06-10 DIAGNOSIS — R2681 Unsteadiness on feet: Secondary | ICD-10-CM | POA: Diagnosis not present

## 2016-06-15 ENCOUNTER — Other Ambulatory Visit: Payer: Self-pay | Admitting: Family Medicine

## 2016-06-15 DIAGNOSIS — E103593 Type 1 diabetes mellitus with proliferative diabetic retinopathy without macular edema, bilateral: Secondary | ICD-10-CM | POA: Diagnosis not present

## 2016-06-24 DIAGNOSIS — F322 Major depressive disorder, single episode, severe without psychotic features: Secondary | ICD-10-CM | POA: Diagnosis not present

## 2016-06-25 ENCOUNTER — Ambulatory Visit: Payer: BLUE CROSS/BLUE SHIELD | Admitting: Sports Medicine

## 2016-07-02 DIAGNOSIS — R296 Repeated falls: Secondary | ICD-10-CM | POA: Diagnosis not present

## 2016-07-02 DIAGNOSIS — R29898 Other symptoms and signs involving the musculoskeletal system: Secondary | ICD-10-CM | POA: Diagnosis not present

## 2016-07-02 DIAGNOSIS — R2689 Other abnormalities of gait and mobility: Secondary | ICD-10-CM | POA: Diagnosis not present

## 2016-07-02 DIAGNOSIS — E103599 Type 1 diabetes mellitus with proliferative diabetic retinopathy without macular edema, unspecified eye: Secondary | ICD-10-CM | POA: Diagnosis not present

## 2016-07-07 ENCOUNTER — Other Ambulatory Visit: Payer: Self-pay

## 2016-07-07 DIAGNOSIS — K5909 Other constipation: Secondary | ICD-10-CM

## 2016-07-07 DIAGNOSIS — E119 Type 2 diabetes mellitus without complications: Secondary | ICD-10-CM

## 2016-07-10 ENCOUNTER — Other Ambulatory Visit: Payer: Self-pay | Admitting: Family Medicine

## 2016-07-10 DIAGNOSIS — F322 Major depressive disorder, single episode, severe without psychotic features: Secondary | ICD-10-CM | POA: Diagnosis not present

## 2016-07-13 ENCOUNTER — Ambulatory Visit: Payer: BLUE CROSS/BLUE SHIELD | Admitting: Sports Medicine

## 2016-07-19 DIAGNOSIS — E1069 Type 1 diabetes mellitus with other specified complication: Secondary | ICD-10-CM | POA: Insufficient documentation

## 2016-07-19 DIAGNOSIS — E785 Hyperlipidemia, unspecified: Secondary | ICD-10-CM

## 2016-07-19 DIAGNOSIS — Z9641 Presence of insulin pump (external) (internal): Secondary | ICD-10-CM | POA: Insufficient documentation

## 2016-07-20 DIAGNOSIS — R296 Repeated falls: Secondary | ICD-10-CM | POA: Diagnosis not present

## 2016-07-20 DIAGNOSIS — E038 Other specified hypothyroidism: Secondary | ICD-10-CM | POA: Diagnosis not present

## 2016-07-20 DIAGNOSIS — E785 Hyperlipidemia, unspecified: Secondary | ICD-10-CM | POA: Diagnosis not present

## 2016-07-20 DIAGNOSIS — E1065 Type 1 diabetes mellitus with hyperglycemia: Secondary | ICD-10-CM | POA: Diagnosis not present

## 2016-07-20 DIAGNOSIS — K3184 Gastroparesis: Secondary | ICD-10-CM | POA: Diagnosis not present

## 2016-07-20 DIAGNOSIS — E10649 Type 1 diabetes mellitus with hypoglycemia without coma: Secondary | ICD-10-CM | POA: Diagnosis not present

## 2016-07-20 DIAGNOSIS — E063 Autoimmune thyroiditis: Secondary | ICD-10-CM | POA: Diagnosis not present

## 2016-07-20 DIAGNOSIS — E1043 Type 1 diabetes mellitus with diabetic autonomic (poly)neuropathy: Secondary | ICD-10-CM | POA: Diagnosis not present

## 2016-07-20 DIAGNOSIS — E103513 Type 1 diabetes mellitus with proliferative diabetic retinopathy with macular edema, bilateral: Secondary | ICD-10-CM | POA: Diagnosis not present

## 2016-07-20 DIAGNOSIS — E103599 Type 1 diabetes mellitus with proliferative diabetic retinopathy without macular edema, unspecified eye: Secondary | ICD-10-CM | POA: Diagnosis not present

## 2016-07-20 DIAGNOSIS — R2689 Other abnormalities of gait and mobility: Secondary | ICD-10-CM | POA: Diagnosis not present

## 2016-07-20 DIAGNOSIS — R29898 Other symptoms and signs involving the musculoskeletal system: Secondary | ICD-10-CM | POA: Diagnosis not present

## 2016-07-20 LAB — HEMOGLOBIN A1C: HEMOGLOBIN A1C: 7.8

## 2016-07-25 ENCOUNTER — Other Ambulatory Visit: Payer: Self-pay | Admitting: Family Medicine

## 2016-07-30 DIAGNOSIS — R296 Repeated falls: Secondary | ICD-10-CM | POA: Diagnosis not present

## 2016-07-30 DIAGNOSIS — R29898 Other symptoms and signs involving the musculoskeletal system: Secondary | ICD-10-CM | POA: Diagnosis not present

## 2016-08-10 ENCOUNTER — Telehealth: Payer: Self-pay | Admitting: Family Medicine

## 2016-08-10 NOTE — Telephone Encounter (Signed)
Called patient and left a message telling pt to call our office to schedule a f/u with Metheney for Hypothyroidism and Hyper Lipidemia

## 2016-08-13 ENCOUNTER — Ambulatory Visit: Payer: BLUE CROSS/BLUE SHIELD | Admitting: Sports Medicine

## 2016-08-17 ENCOUNTER — Other Ambulatory Visit: Payer: Self-pay | Admitting: Family Medicine

## 2016-08-24 ENCOUNTER — Telehealth: Payer: Self-pay

## 2016-08-24 ENCOUNTER — Encounter: Payer: Self-pay | Admitting: Family Medicine

## 2016-08-24 DIAGNOSIS — G609 Hereditary and idiopathic neuropathy, unspecified: Secondary | ICD-10-CM

## 2016-08-24 DIAGNOSIS — F322 Major depressive disorder, single episode, severe without psychotic features: Secondary | ICD-10-CM | POA: Diagnosis not present

## 2016-08-24 NOTE — Telephone Encounter (Signed)
Ok to place referral.

## 2016-08-25 ENCOUNTER — Ambulatory Visit: Payer: BLUE CROSS/BLUE SHIELD | Admitting: Sports Medicine

## 2016-08-26 NOTE — Telephone Encounter (Signed)
Referral ordered

## 2016-09-09 ENCOUNTER — Telehealth: Payer: Self-pay | Admitting: Family Medicine

## 2016-09-09 NOTE — Telephone Encounter (Signed)
Left pt a message telling her she needs to call our office and schedule a f/u appt with Dr.Metheney for Thyroid and labs f/u

## 2016-09-14 DIAGNOSIS — K3184 Gastroparesis: Secondary | ICD-10-CM | POA: Diagnosis not present

## 2016-09-14 DIAGNOSIS — E1065 Type 1 diabetes mellitus with hyperglycemia: Secondary | ICD-10-CM | POA: Diagnosis not present

## 2016-09-14 DIAGNOSIS — E1043 Type 1 diabetes mellitus with diabetic autonomic (poly)neuropathy: Secondary | ICD-10-CM | POA: Diagnosis not present

## 2016-09-14 DIAGNOSIS — E785 Hyperlipidemia, unspecified: Secondary | ICD-10-CM | POA: Diagnosis not present

## 2016-09-14 DIAGNOSIS — E103513 Type 1 diabetes mellitus with proliferative diabetic retinopathy with macular edema, bilateral: Secondary | ICD-10-CM | POA: Diagnosis not present

## 2016-09-14 DIAGNOSIS — E063 Autoimmune thyroiditis: Secondary | ICD-10-CM | POA: Diagnosis not present

## 2016-09-14 DIAGNOSIS — Z9641 Presence of insulin pump (external) (internal): Secondary | ICD-10-CM | POA: Diagnosis not present

## 2016-09-14 DIAGNOSIS — E10649 Type 1 diabetes mellitus with hypoglycemia without coma: Secondary | ICD-10-CM | POA: Diagnosis not present

## 2016-09-14 DIAGNOSIS — E1069 Type 1 diabetes mellitus with other specified complication: Secondary | ICD-10-CM | POA: Diagnosis not present

## 2016-09-14 DIAGNOSIS — Z853 Personal history of malignant neoplasm of breast: Secondary | ICD-10-CM | POA: Diagnosis not present

## 2016-10-07 DIAGNOSIS — M898X7 Other specified disorders of bone, ankle and foot: Secondary | ICD-10-CM | POA: Diagnosis not present

## 2016-10-07 DIAGNOSIS — E1065 Type 1 diabetes mellitus with hyperglycemia: Secondary | ICD-10-CM | POA: Diagnosis not present

## 2016-10-07 DIAGNOSIS — Z853 Personal history of malignant neoplasm of breast: Secondary | ICD-10-CM | POA: Diagnosis not present

## 2016-10-07 DIAGNOSIS — S90819A Abrasion, unspecified foot, initial encounter: Secondary | ICD-10-CM | POA: Diagnosis not present

## 2016-10-07 DIAGNOSIS — M2041 Other hammer toe(s) (acquired), right foot: Secondary | ICD-10-CM | POA: Diagnosis not present

## 2016-10-08 ENCOUNTER — Other Ambulatory Visit: Payer: Self-pay | Admitting: Family Medicine

## 2016-10-13 DIAGNOSIS — I240 Acute coronary thrombosis not resulting in myocardial infarction: Secondary | ICD-10-CM | POA: Diagnosis not present

## 2016-10-13 DIAGNOSIS — E063 Autoimmune thyroiditis: Secondary | ICD-10-CM | POA: Diagnosis not present

## 2016-10-13 DIAGNOSIS — Z853 Personal history of malignant neoplasm of breast: Secondary | ICD-10-CM | POA: Diagnosis not present

## 2016-10-13 DIAGNOSIS — E103513 Type 1 diabetes mellitus with proliferative diabetic retinopathy with macular edema, bilateral: Secondary | ICD-10-CM | POA: Diagnosis not present

## 2016-10-13 DIAGNOSIS — K3184 Gastroparesis: Secondary | ICD-10-CM | POA: Diagnosis not present

## 2016-10-13 DIAGNOSIS — E10649 Type 1 diabetes mellitus with hypoglycemia without coma: Secondary | ICD-10-CM | POA: Diagnosis not present

## 2016-10-13 DIAGNOSIS — E1065 Type 1 diabetes mellitus with hyperglycemia: Secondary | ICD-10-CM | POA: Diagnosis not present

## 2016-10-13 DIAGNOSIS — Z951 Presence of aortocoronary bypass graft: Secondary | ICD-10-CM | POA: Diagnosis not present

## 2016-10-13 DIAGNOSIS — E785 Hyperlipidemia, unspecified: Secondary | ICD-10-CM | POA: Diagnosis not present

## 2016-10-13 DIAGNOSIS — E1043 Type 1 diabetes mellitus with diabetic autonomic (poly)neuropathy: Secondary | ICD-10-CM | POA: Diagnosis not present

## 2016-10-14 ENCOUNTER — Other Ambulatory Visit: Payer: Self-pay | Admitting: Family Medicine

## 2016-10-15 ENCOUNTER — Telehealth: Payer: Self-pay | Admitting: Cardiology

## 2016-10-15 NOTE — Telephone Encounter (Signed)
Pt of Dr. Stanford Breed  Returned call.  Pt called to voice concern for a "small circle" on right side of chest, surges of discomfort.  Doesn't think it's reproducible. Unsure if relieved by OTC meds  Reports back pain but thinks unrelated. no SOB, denies jaw, neck, arm, or abdominal pain.   slight pain, "more pronounced" if breathing in. Discomfort rated: 1-2 out of 10. "I just notice it."  Occurs 2-3 times a day. Usually brief 1-5 seconds. Had once today, lasted ~5 seconds   She will be due for 6 mo f/u w Dr. Stanford Breed, can schedule to be seen in May. Advised I'm not sure that her symptom warrants urgent attention, would seek review from [facility] & f/u. Pt voiced understanding and thanks.

## 2016-10-15 NOTE — Telephone Encounter (Signed)
Pt c/o of Chest Pain: 1. Are you having CP right now? yes 2. Are you experiencing any other symptoms (ex. SOB, nausea, vomiting, sweating)? no 3. How long have you been experiencing CP?about a month  4. Is your CP continuous or coming and going?coming and going   5. Have you taken Nitroglycerin? no

## 2016-10-16 ENCOUNTER — Ambulatory Visit (INDEPENDENT_AMBULATORY_CARE_PROVIDER_SITE_OTHER): Payer: BLUE CROSS/BLUE SHIELD | Admitting: Sports Medicine

## 2016-10-16 ENCOUNTER — Ambulatory Visit (INDEPENDENT_AMBULATORY_CARE_PROVIDER_SITE_OTHER): Payer: BLUE CROSS/BLUE SHIELD

## 2016-10-16 DIAGNOSIS — M51369 Other intervertebral disc degeneration, lumbar region without mention of lumbar back pain or lower extremity pain: Secondary | ICD-10-CM

## 2016-10-16 DIAGNOSIS — M5136 Other intervertebral disc degeneration, lumbar region: Secondary | ICD-10-CM

## 2016-10-16 DIAGNOSIS — G8929 Other chronic pain: Secondary | ICD-10-CM | POA: Diagnosis not present

## 2016-10-16 DIAGNOSIS — M5030 Other cervical disc degeneration, unspecified cervical region: Secondary | ICD-10-CM

## 2016-10-16 DIAGNOSIS — M545 Low back pain: Secondary | ICD-10-CM

## 2016-10-16 DIAGNOSIS — M50321 Other cervical disc degeneration at C4-C5 level: Secondary | ICD-10-CM | POA: Diagnosis not present

## 2016-10-16 DIAGNOSIS — M4602 Spinal enthesopathy, cervical region: Secondary | ICD-10-CM | POA: Diagnosis not present

## 2016-10-16 MED ORDER — PREDNISONE 50 MG PO TABS: ORAL_TABLET | ORAL | 0 refills | Status: DC

## 2016-10-16 MED ORDER — MELOXICAM 15 MG PO TABS: ORAL_TABLET | ORAL | 3 refills | Status: DC

## 2016-10-16 NOTE — Assessment & Plan Note (Signed)
Occasional right periscapular symptoms. PT as well, xrays,prednisone. Return in 4 weeks, MR for interventional planning if no better.

## 2016-10-16 NOTE — Progress Notes (Signed)
   Subjective:    I'm seeing this patient as a consultation for:  Dr. Beatrice Lecher  CC: low back pain  HPI: This is a pleasant 61 year old female with a long-standing history of axial low back pain, worse with sitting, flexion, Valsalva, radiating down the left leg, to the back of the calf but not yet to the foot. She also has similar pain in the back of her neck, worse with prolonged downgaze, no radiation to the arm but she does have some medial periscapular type symptoms on the right.  Past medical history:  Negative.  See flowsheet/record as well for more information.  Surgical history: Negative.  See flowsheet/record as well for more information.  Family history: Negative.  See flowsheet/record as well for more information.  Social history: Negative.  See flowsheet/record as well for more information.  Allergies, and medications have been entered into the medical record, reviewed, and no changes needed.   Review of Systems: No headache, visual changes, nausea, vomiting, diarrhea, constipation, dizziness, abdominal pain, skin rash, fevers, chills, night sweats, weight loss, swollen lymph nodes, body aches, joint swelling, muscle aches, chest pain, shortness of breath, mood changes, visual or auditory hallucinations.   Objective:   General: Well Developed, well nourished, and in no acute distress.  Neuro/Psych: Alert and oriented x3, extra-ocular muscles intact, able to move all 4 extremities, sensation grossly intact. Skin: Warm and dry, no rashes noted.  Respiratory: Not using accessory muscles, speaking in full sentences, trachea midline.  Cardiovascular: Pulses palpable, no extremity edema. Abdomen: Does not appear distended. Back Exam:  Inspection: Unremarkable  Motion: Flexion 45 deg, Extension 45 deg, Side Bending to 45 deg bilaterally,  Rotation to 45 deg bilaterally  SLR laying: Negative  XSLR laying: Negative  Palpable tenderness: None. FABER: negative. Sensory  change: Gross sensation intact to all lumbar and sacral dermatomes.  Reflexes: 2+ at both patellar tendons, 2+ at achilles tendons, Babinski's downgoing.  Strength at foot  Plantar-flexion: 5/5 Dorsi-flexion: 5/5 Eversion: 5/5 Inversion: 5/5  Leg strength  Quad: 5/5 Hamstring: 5/5 Hip flexor: 5/5 Hip abductors: 5/5  Gait unremarkable. Neck: Negative spurling's Full neck range of motion Grip strength and sensation normal in bilateral hands Strength good C4 to T1 distribution No sensory change to C4 to T1 Reflexes normal  Impression and Recommendations:   This case required medical decision making of moderate complexity.  DDD (degenerative disc disease), lumbar With occasional left sided radicular symptoms. Prednisone, Xrays, PT. Return in 4 weeks.  DDD (degenerative disc disease), cervical Occasional right periscapular symptoms. PT as well, xrays,prednisone. Return in 4 weeks, MR for interventional planning if no better.

## 2016-10-16 NOTE — Assessment & Plan Note (Signed)
With occasional left sided radicular symptoms. Prednisone, Xrays, PT. Return in 4 weeks.

## 2016-10-18 NOTE — Telephone Encounter (Signed)
Not necessarily a DOD question.  Defer to Dr. Stanford Breed.  [place]

## 2016-10-19 ENCOUNTER — Telehealth: Payer: Self-pay

## 2016-10-19 ENCOUNTER — Telehealth: Payer: Self-pay | Admitting: Cardiology

## 2016-10-19 NOTE — Telephone Encounter (Signed)
Returned the phone call to the patient. She stated that she was following up from her previous call. Per Dr. Stanford Breed: Doubt cardiac. Fu as scheduled. She stated that she wanted to be seen soon. An appointment was made for May 2nd at 3:30 with Bernerd Pho, [place].

## 2016-10-19 NOTE — Telephone Encounter (Signed)
Pt left VM stating you gave her prednisone but she would like something for pain. Please advise.

## 2016-10-19 NOTE — Telephone Encounter (Signed)
Doubt cardiac. Fu as scheduled Kirk Ruth[place]

## 2016-10-19 NOTE — Telephone Encounter (Signed)
New Message  Pt voiced waiting for nurse to give her a call back, did inform pt MD-Crenshaw did respond this morning and someone will be giving her a call today in regards to his response.  Please f/u

## 2016-10-19 NOTE — Telephone Encounter (Signed)
The meloxicam is for pain.

## 2016-10-22 DIAGNOSIS — E109 Type 1 diabetes mellitus without complications: Secondary | ICD-10-CM | POA: Diagnosis not present

## 2016-10-26 DIAGNOSIS — E103593 Type 1 diabetes mellitus with proliferative diabetic retinopathy without macular edema, bilateral: Secondary | ICD-10-CM | POA: Diagnosis not present

## 2016-10-26 DIAGNOSIS — D3131 Benign neoplasm of right choroid: Secondary | ICD-10-CM | POA: Diagnosis not present

## 2016-10-28 ENCOUNTER — Ambulatory Visit: Payer: BLUE CROSS/BLUE SHIELD | Admitting: Student

## 2016-10-28 NOTE — Progress Notes (Deleted)
Cardiology Office Note    Date:  10/28/2016   ID:  Alejandra Valdez, DOB 11-01-1955, MRN [id number]  PCP:  Beatrice Lecher, MD  Cardiologist: Dr. Stanford Breed   No chief complaint on file.   History of Present Illness:    Alejandra Valdez is a 61 y.o. female with past medical history of CAD (s/p CABG in 2013 with LIMA-LAD, SVG-RCA, and SVG-OM1-D2), HLD, Type 2 DM,and history of breast cancer who presents to the office today for evaluation of ***  She was last examined by Dr. Stanford Breed in 03/2016 and denied any recent chest pain at that time. Reported episodes of back pain occurring since her bypass surgery and that she had similar symptoms with her prior infarct. A Lexiscan Myoview was obtained for risk stratification and showed no evidence of ischemia or prior infarction and was overall a low-risk study.   She called the office on 10/15/2016 reporting an episode of chest pain along her right chest and exacerbated with taking deep breaths and lasting for a few seconds at a time. Was thought to be atypical for a cardiac etiology.   Past Medical History:  Diagnosis Date  . ADHD (attention deficit hyperactivity disorder)   . Anxiety   . Arthritis    Of spine DDD/DJD  . Cancer [facility]) 2012   Breast  . Depression   . Diabetes mellitus without complication ([place])   . H/O bilateral breast implants 01/24/2016  . History of colon polyps   . Hypothyroid   . Lung nodule    Stable per CT 05/2013  . Migraine   . Myocardial infarction 2013  . Neuropathy ([place])   . Osteoporosis     Past Surgical History:  Procedure Laterality Date  . ABDOMINAL HYSTERECTOMY     has left ovary  . APPENDECTOMY    . BILATERAL CARPAL TUNNEL RELEASE    . BLEPHAROPLASTY Bilateral   . CATARACT EXTRACTION Bilateral   . CESAREAN SECTION     x2  . CORONARY ARTERY BYPASS GRAFT  2013   x4  . MASTECTOMY Bilateral 2012  . OTHER SURGICAL HISTORY     Retinal repair  . SHOULDER SURGERY Bilateral     Current  Medications: Outpatient Medications Prior to Visit  Medication Sig Dispense Refill  . BAYER CONTOUR NEXT TEST test strip 150 each as needed.  3  . celecoxib (CELEBREX) 200 MG capsule Take 1 capsule (200 mg total) by mouth daily. *NO ADDITIONAL REFILLS. PLEASE CALL THE OFFICE TO SCHEDULE AN APPOINTMENT* 30 capsule 0  . clonazePAM (KLONOPIN) 1 MG tablet Take 2 mg by mouth daily as needed.   0  . cycloSPORINE (RESTASIS) 0.05 % ophthalmic emulsion Place 1 drop into both eyes 2 (two) times daily. 1 each 1  . DULoxetine (CYMBALTA) 60 MG capsule TAKE 1 CAPSULE(60 MG) BY MOUTH DAILY 30 capsule 2  . ezetimibe (ZETIA) 10 MG tablet TAKE 1 TABLET(10 MG) BY MOUTH DAILY. NEED FOLLOW UP APPOINTMENT FOR MORE REFILLS 7 tablet 0  . ibandronate (BONIVA) 150 MG tablet Take one tablet by mouth every 30 days. 3 tablet 0  . insulin aspart (NOVOLOG) 100 UNIT/ML injection Inject into the skin as needed for high blood sugar.    . lamoTRIgine (LAMICTAL) 100 MG tablet Take 100 mg by mouth daily.    Marland Kitchen LATUDA 20 MG TABS tablet TAKE 1 TABLET(20 MG) BY MOUTH AT BEDTIME 30 tablet 0  . levothyroxine (SYNTHROID, LEVOTHROID) 137 MCG tablet TAKE 1 TABLET(137 MCG) BY MOUTH DAILY BEFORE BREAKFAST  7 tablet 0  . Liraglutide (VICTOZA) 18 MG/3ML SOPN Inject 3 mLs into the skin 3 (three) times daily.    . meloxicam (MOBIC) 15 MG tablet One tab PO qAM with breakfast for 2 weeks, then daily prn pain. 30 tablet 3  . metoprolol succinate (TOPROL-XL) 25 MG 24 hr tablet Take 1 tablet (25 mg total) by mouth daily. *NO ADDITIONAL REFILLS. PLEASE CALL THE OFFICE TO SCHEDULE AN APPOINTMENT* 30 tablet 0  . naratriptan (AMERGE) 2.5 MG tablet Take 2.5 mg by mouth. Take one tablet at onset of migraine do not take more than 1 a day  0  . nortriptyline (PAMELOR) 10 MG capsule TAKE 1 CAPSULE(10 MG) BY MOUTH THREE TIMES DAILY. FOLLOW UP APPOINTMENT BEFORE MORE REFILLS (Patient taking differently: TAKE 1 CAPSULE(10 MG) BY MOUTH at bedtime) 30 capsule 0  .  omeprazole (PRILOSEC) 40 MG capsule TAKE 1 CAPSULE(40 MG) BY MOUTH TWICE DAILY 60 capsule 0  . predniSONE (DELTASONE) 50 MG tablet One tab PO daily for 5 days. 5 tablet 0  . Prenat Vit-Fe Gly Cys-FA-Omega (ENBRACE HR) CAPS Take 1 capsule by mouth daily. 90 capsule 3  . PRISTIQ 50 MG 24 hr tablet TAKE 1 TABLET BY MOUTH EVERY DAY 30 tablet 5  . simvastatin (ZOCOR) 40 MG tablet Take 1 tablet (40 mg total) by mouth daily at 6 PM. NEED FOLLOW UP APPOINTMENT FOR MORE REFILLS 15 tablet 0  . Sulfacetamide Sodium-Sulfur (AVAR LS CLEANSER EX) Apply topically.    . SUMAtriptan Succinate (ONZETRA XSAIL) 11 MG/NOSEPC EXHP Place into the nose.    . temazepam (RESTORIL) 15 MG capsule TK ONE C PO QHS  1  . VIIBRYD 40 MG TABS   0   No facility-administered medications prior to visit.      Allergies:   Vicodin [hydrocodone-acetaminophen]   Social History   Social History  . Marital status: Married    Spouse name: bobby   . Number of children: 1  . Years of education: N/A   Occupational History  . Disabled    Social History Main Topics  . Smoking status: Never Smoker  . Smokeless tobacco: Not on file  . Alcohol use No  . Drug use: No  . Sexual activity: No   Other Topics Concern  . Not on file   Social History Narrative   No daily caffeine intake. Walks for 30 minutes for exercise. She is currently disabled.     Family History:  The patient's ***family history includes Alcohol abuse in her brother, brother, and father; Cancer in her maternal grandmother; Depression in her brother, brother, and mother; Diabetes in her maternal grandfather; Heart attack in her maternal grandmother; Hyperlipidemia in her brother, brother, and father; Hypertension in her brother, brother, and father; Stroke in her father.   Review of Systems:   Please see the history of present illness.     General:  No chills, fever, night sweats or weight changes.  Cardiovascular:  No chest pain, dyspnea on exertion, edema,  orthopnea, palpitations, paroxysmal nocturnal dyspnea. Dermatological: No rash, lesions/masses Respiratory: No cough, dyspnea Urologic: No hematuria, dysuria Abdominal:   No nausea, vomiting, diarrhea, bright red blood per rectum, melena, or hematemesis Neurologic:  No visual changes, wkns, changes in mental status. All other systems reviewed and are otherwise negative except as noted above.   Physical Exam:    VS:  There were no vitals taken for this visit.   General: Well developed, well nourished,female appearing in no acute distress.  Head: Normocephalic, atraumatic, sclera non-icteric, no xanthomas, nares are without discharge.  Neck: No carotid bruits. JVD not elevated.  Lungs: Respirations regular and unlabored, without wheezes or rales.  Heart: ***Regular rate and rhythm. No S3 or S4.  No murmur, no rubs, or gallops appreciated. Abdomen: Soft, non-tender, non-distended with normoactive bowel sounds. No hepatomegaly. No rebound/guarding. No obvious abdominal masses. Msk:  Strength and tone appear normal for age. No joint deformities or effusions. Extremities: No clubbing or cyanosis. No edema.  Distal pedal pulses are 2+ bilaterally. Neuro: Alert and oriented X 3. Moves all extremities spontaneously. No focal deficits noted. Psych:  Responds to questions appropriately with a normal affect. Skin: No rashes or lesions noted  Wt Readings from Last 3 Encounters:  10/16/16 156 lb 9.6 oz (71 kg)  05/28/16 161 lb (73 kg)  05/11/16 166 lb (75.3 kg)     Studies/Labs Reviewed:   EKG:  EKG is*** ordered today.  The ekg ordered today demonstrates ***  Recent Labs: 05/28/2016: ALT 17; BUN 12; Creat 0.72; Potassium 4.3; Sodium 142   Lipid Panel    Component Value Date/Time   CHOL 123 05/28/2016 1527   TRIG 79 05/28/2016 1527   HDL 53 05/28/2016 1527   CHOLHDL 2.3 05/28/2016 1527   VLDL 16 05/28/2016 1527   LDLCALC 54 05/28/2016 1527    Additional studies/ records that were  reviewed today include:   NST: 04/2016  Nuclear stress EF: 57%.  There was no ST segment deviation noted during stress.  The study is normal.  This is a low risk study.  The left ventricular ejection fraction is normal (55-65%).   Normal nuclear stress test with no evidence of infarct or ischemia.   Assessment:    No diagnosis found.   Plan:   In order of problems listed above:  1. ***    Medication Adjustments/Labs and Tests Ordered: Current medicines are reviewed at length with the patient today.  Concerns regarding medicines are outlined above.  Medication changes, Labs and Tests ordered today are listed in the Patient Instructions below. There are no Patient Instructions on file for this visit.   Signed, Erma Heritage, PA-C  10/28/2016 7:59 AM    [facility] [place], [place], [place]  [address] Phone: [phone]; Fax: [phone]  [address] [place], [place] [address] Phone: [phone]

## 2016-11-01 ENCOUNTER — Other Ambulatory Visit: Payer: Self-pay | Admitting: Family Medicine

## 2016-11-11 ENCOUNTER — Other Ambulatory Visit: Payer: Self-pay | Admitting: Family Medicine

## 2016-11-13 DIAGNOSIS — E109 Type 1 diabetes mellitus without complications: Secondary | ICD-10-CM | POA: Diagnosis not present

## 2016-11-17 ENCOUNTER — Other Ambulatory Visit: Payer: Self-pay | Admitting: *Deleted

## 2016-11-17 DIAGNOSIS — F322 Major depressive disorder, single episode, severe without psychotic features: Secondary | ICD-10-CM | POA: Diagnosis not present

## 2016-11-17 MED ORDER — IBANDRONATE SODIUM 150 MG PO TABS: ORAL_TABLET | ORAL | 3 refills | Status: DC

## 2016-11-17 NOTE — Progress Notes (Signed)
Cardiology Office Note    Date:  11/18/2016   ID:  Alejandra Valdez, DOB 12-07-1955, MRN [id number]  PCP:  Hali Marry, MD  Cardiologist: Dr. Stanford Breed   Chief Complaint  Patient presents with  . Chest Pain  . Palpitations    History of Present Illness:    Alejandra Valdez is a 61 y.o. female with past medical history of CAD (s/p CABG in 2013 with LIMA-LAD, SVG-RCA, and SVG-OM1-D2), HLD, IDDM, and prior history of breast cancer who presents to the office today for evaluation of chest pain.   She was last examined by Dr. Stanford Breed in 03/2016 and reported having episodes of back pain which were similar to her prior infarcts, therefore a NST was ordered. This was obtained on 04/2016 and showed a preserved EF of 57% with no evidence of ischemia, overall being a low-risk study.   She called the office on 4/19 reporting intermittent episodes of chest discomfort along her right chest which was exacerbated with taking deep breaths and lasting for a few seconds at a time. Was thought to be atypical for a cardiac etiology.   In talking with the patient today, she reports episodes of chest discomfort occurring along her right pectoral region and alternating with her left pectoral region which can last for up to a minute at a time. She denies any associated dyspnea, diaphoresis, nausea, or vomiting. She also notes occasional palpitations with this but has been out of her Toprol-XL for the past 2 weeks.  Her symptoms are not necessarily worse with exertion and have mostly occurred at rest. She does report an occasional pain along the left side of her neck which slightly resembles what she experienced in 2013, but her neck pain has been present since 2013 as well.    Past Medical History:  Diagnosis Date  . ADHD (attention deficit hyperactivity disorder)   . Anxiety   . Arthritis    Of spine DDD/DJD  . CAD (coronary artery disease)    a. s/p CABG in 2013 with LIMA-LAD, SVG-RCA, and SVG-OM1-D2  .  Cancer [facility]) 2012   Breast  . Depression   . Diabetes mellitus without complication ([place])   . H/O bilateral breast implants 01/24/2016  . History of colon polyps   . Hypothyroid   . Lung nodule    Stable per CT 05/2013  . Migraine   . Myocardial infarction ([place]) 2013  . Neuropathy   . Osteoporosis     Past Surgical History:  Procedure Laterality Date  . ABDOMINAL HYSTERECTOMY     has left ovary  . APPENDECTOMY    . BILATERAL CARPAL TUNNEL RELEASE    . BLEPHAROPLASTY Bilateral   . CATARACT EXTRACTION Bilateral   . CESAREAN SECTION     x2  . CORONARY ARTERY BYPASS GRAFT  2013   x4  . MASTECTOMY Bilateral 2012  . OTHER SURGICAL HISTORY     Retinal repair  . SHOULDER SURGERY Bilateral     Current Medications: Outpatient Medications Prior to Visit  Medication Sig Dispense Refill  . BAYER CONTOUR NEXT TEST test strip 150 each as needed.  3  . celecoxib (CELEBREX) 200 MG capsule Take 1 capsule (200 mg total) by mouth daily. *NO ADDITIONAL REFILLS. PLEASE CALL THE OFFICE TO SCHEDULE AN APPOINTMENT* 30 capsule 0  . clonazePAM (KLONOPIN) 1 MG tablet Take 2 mg by mouth daily as needed.   0  . cycloSPORINE (RESTASIS) 0.05 % ophthalmic emulsion Place 1 drop into both eyes  2 (two) times daily. 1 each 1  . DULoxetine (CYMBALTA) 60 MG capsule TAKE 1 CAPSULE(60 MG) BY MOUTH DAILY 30 capsule 2  . ibandronate (BONIVA) 150 MG tablet Take one tablet by mouth every 30 days. 3 tablet 3  . insulin aspart (NOVOLOG) 100 UNIT/ML injection Inject into the skin as needed for high blood sugar.    . lamoTRIgine (LAMICTAL) 100 MG tablet Take 100 mg by mouth daily.    Marland Kitchen LATUDA 20 MG TABS tablet TAKE 1 TABLET(20 MG) BY MOUTH AT BEDTIME 30 tablet 0  . levothyroxine (SYNTHROID, LEVOTHROID) 137 MCG tablet TAKE 1 TABLET(137 MCG) BY MOUTH DAILY BEFORE BREAKFAST 7 tablet 0  . Liraglutide (VICTOZA) 18 MG/3ML SOPN Inject 3 mLs into the skin 3 (three) times daily.    . meloxicam (MOBIC) 15 MG tablet One tab PO  qAM with breakfast for 2 weeks, then daily prn pain. 30 tablet 3  . naratriptan (AMERGE) 2.5 MG tablet Take 2.5 mg by mouth. Take one tablet at onset of migraine do not take more than 1 a day  0  . nortriptyline (PAMELOR) 10 MG capsule TAKE 1 CAPSULE(10 MG) BY MOUTH THREE TIMES DAILY. FOLLOW UP APPOINTMENT BEFORE MORE REFILLS (Patient taking differently: TAKE 1 CAPSULE(10 MG) BY MOUTH at bedtime) 30 capsule 0  . omeprazole (PRILOSEC) 40 MG capsule TAKE 1 CAPSULE(40 MG) BY MOUTH TWICE DAILY 60 capsule 0  . omeprazole (PRILOSEC) 40 MG capsule TAKE 1 CAPSULE BY MOUTH EVERY DAY 90 capsule 0  . predniSONE (DELTASONE) 50 MG tablet One tab PO daily for 5 days. 5 tablet 0  . Prenat Vit-Fe Gly Cys-FA-Omega (ENBRACE HR) CAPS Take 1 capsule by mouth daily. 90 capsule 3  . PRISTIQ 50 MG 24 hr tablet TAKE 1 TABLET BY MOUTH EVERY DAY 30 tablet 5  . Sulfacetamide Sodium-Sulfur (AVAR LS CLEANSER EX) Apply topically.    . SUMAtriptan Succinate (ONZETRA XSAIL) 11 MG/NOSEPC EXHP Place into the nose.    . temazepam (RESTORIL) 15 MG capsule TK ONE C PO QHS  1  . VIIBRYD 40 MG TABS   0  . ezetimibe (ZETIA) 10 MG tablet TAKE 1 TABLET(10 MG) BY MOUTH DAILY. NEED FOLLOW UP APPOINTMENT FOR MORE REFILLS 7 tablet 0  . metoprolol succinate (TOPROL-XL) 25 MG 24 hr tablet Take 1 tablet (25 mg total) by mouth daily. *NO ADDITIONAL REFILLS. PLEASE CALL THE OFFICE TO SCHEDULE AN APPOINTMENT* 30 tablet 0  . simvastatin (ZOCOR) 40 MG tablet Take 1 tablet (40 mg total) by mouth daily at 6 PM. NEED FOLLOW UP APPOINTMENT FOR MORE REFILLS 15 tablet 0   No facility-administered medications prior to visit.      Allergies:   Vicodin [hydrocodone-acetaminophen]   Social History   Social History  . Marital status: Married    Spouse name: bobby   . Number of children: 1  . Years of education: N/A   Occupational History  . Disabled    Social History Main Topics  . Smoking status: Never Smoker  . Smokeless tobacco: Never Used  .  Alcohol use No  . Drug use: No  . Sexual activity: No   Other Topics Concern  . None   Social History Narrative   No daily caffeine intake. Walks for 30 minutes for exercise. She is currently disabled.     Family History:  The patient's family history includes Alcohol abuse in her brother, brother, and father; Cancer in her maternal grandmother; Depression in her brother, brother, and mother;  Diabetes in her maternal grandfather; Heart attack in her maternal grandmother; Hyperlipidemia in her brother, brother, and father; Hypertension in her brother, brother, and father; Stroke in her father.   Review of Systems:   Please see the history of present illness.     General:  No chills, fever, night sweats or weight changes.  Cardiovascular:  No dyspnea on exertion, edema, orthopnea, paroxysmal nocturnal dyspnea. Positive for chest pain and palpitations.  Dermatological: No rash, lesions/masses Respiratory: No cough, dyspnea Urologic: No hematuria, dysuria Abdominal:   No nausea, vomiting, diarrhea, bright red blood per rectum, melena, or hematemesis Neurologic:  No visual changes, wkns, changes in mental status. All other systems reviewed and are otherwise negative except as noted above.   Physical Exam:    VS:  BP 124/76   Pulse 88   Ht 5\' 7"  (1.702 m)   Wt 164 lb (74.4 kg)   BMI 25.69 kg/m    General: Well developed, well nourished Caucasian female appearing in no acute distress. Head: Normocephalic, atraumatic, sclera non-icteric, no xanthomas, nares are without discharge.  Neck: No carotid bruits. JVD not elevated.  Lungs: Respirations regular and unlabored, without wheezes or rales.  Heart: Regular rate and rhythm. No S3 or S4.  No murmur, no rubs, or gallops appreciated. Abdomen: Soft, non-tender, non-distended with normoactive bowel sounds. No hepatomegaly. No rebound/guarding. No obvious abdominal masses. Msk:  Strength and tone appear normal for age. No joint deformities  or effusions. Extremities: No clubbing or cyanosis. No lower extremity edema.  Distal pedal pulses are 2+ bilaterally. Neuro: Alert and oriented X 3. Moves all extremities spontaneously. No focal deficits noted. Psych:  Responds to questions appropriately with a normal affect. Skin: No rashes or lesions noted  Wt Readings from Last 3 Encounters:  11/18/16 164 lb (74.4 kg)  10/16/16 156 lb 9.6 oz (71 kg)  05/28/16 161 lb (73 kg)      Studies/Labs Reviewed:   EKG:  EKG is ordered today.  The ekg ordered today demonstrates NSR, HR 88, with nonspecific ST changes (similar to prior tracings).   Recent Labs: 05/28/2016: ALT 17; BUN 12; Creat 0.72; Potassium 4.3; Sodium 142   Lipid Panel    Component Value Date/Time   CHOL 123 05/28/2016 1527   TRIG 79 05/28/2016 1527   HDL 53 05/28/2016 1527   CHOLHDL 2.3 05/28/2016 1527   VLDL 16 05/28/2016 1527   LDLCALC 54 05/28/2016 1527    Additional studies/ records that were reviewed today include:   NST: 04/2016  Nuclear stress EF: 57%.  There was no ST segment deviation noted during stress.  The study is normal.  This is a low risk study.  The left ventricular ejection fraction is normal (55-65%).   Normal nuclear stress test with no evidence of infarct or ischemia.   Assessment:    1. Coronary artery disease involving coronary bypass graft of native heart with angina pectoris (HCC)   2. Chest pain, unspecified type   3. Heart palpitations   4. Hyperlipidemia LDL goal <70   5. IDDM (insulin dependent diabetes mellitus) ([place])      Plan:   In order of problems listed above:  1. Chest Pain/ CAD - she has a known history of CAD, s/p CABG in 2013 with LIMA-LAD, SVG-RCA, and SVG-OM1-D2. Recent NST in 04/2016 showed a preserved EF of 57% with no evidence of ischemia, overall being a low-risk study.  - she describes today having recent episodes of chest discomfort occurring along  her right pectoral region and alternating with  her left pectoral region which can last for up to a minute at a time. She denies any associated dyspnea, diaphoresis, nausea, or vomiting. No association of her symptoms with exertion. Does note pain along her left neck which she experienced in 2013 but also mentions this never fully resolved following her CABG. - EKG is ordered today and shows no acute ST or T-wave changes.  - will obtain an echocardiogram to assess LV function and wall motion. If no significant abnormalities noted, would not pursue further ischemic evaluation at this time with her atypical symptoms and recent low-risk NST.  - continue ASA 81mg  daily, BB, and statin therapy.   2. Palpitations - reports a history of PAC's and PVC's. - she recently ran out of her BB and noticed worsening palpitations which would spontaneously resolve. - resume Toprol-XL 25mg  daily.   3. HLD - Lipid Panel in 04/2016 showed a total cholesterol of 123, HDL 53, and LDL 54. At goal with LDL < 70.  - continue Zetia and Zocor 40mg  daily.   4. IDDM - Insulin pump in place.  - followed by Endocrinology.    Medication Adjustments/Labs and Tests Ordered: Current medicines are reviewed at length with the patient today.  Concerns regarding medicines are outlined above.  Medication changes, Labs and Tests ordered today are listed in the Patient Instructions below. Patient Instructions  Medication Instructions: No changes   Procedures/Testing: Your physician has requested that you have an echocardiogram. Echocardiography is a painless test that uses sound waves to create images of your heart. It provides your doctor with information about the size and shape of your heart and how well your heart's chambers and valves are working. This procedure takes approximately one hour. There are no restrictions for this procedure. This will take place at [address]  Follow-Up: Your physician wants you to follow-up in: October with Dr. Trellis Moment  will receive a reminder letter in the mail two months in advance. If you don't receive a letter, please call our office to schedule the follow-up appointment.  If you need a refill on your cardiac medications before your next appointment, please call your pharmacy.     Signed, Erma Heritage, PA-C  11/18/2016 8:59 PM    [facility] [place], [place], [place]  [address] Phone: [phone]; Fax: [phone]  [address], [place], [place] [address] Phone: [phone]

## 2016-11-18 ENCOUNTER — Encounter: Payer: Self-pay | Admitting: Student

## 2016-11-18 ENCOUNTER — Ambulatory Visit (INDEPENDENT_AMBULATORY_CARE_PROVIDER_SITE_OTHER): Payer: BLUE CROSS/BLUE SHIELD | Admitting: Student

## 2016-11-18 VITALS — BP 124/76 | HR 88 | Ht 67.0 in | Wt 164.0 lb

## 2016-11-18 DIAGNOSIS — Z794 Long term (current) use of insulin: Secondary | ICD-10-CM | POA: Diagnosis not present

## 2016-11-18 DIAGNOSIS — R079 Chest pain, unspecified: Secondary | ICD-10-CM | POA: Diagnosis not present

## 2016-11-18 DIAGNOSIS — E119 Type 2 diabetes mellitus without complications: Secondary | ICD-10-CM

## 2016-11-18 DIAGNOSIS — I25709 Atherosclerosis of coronary artery bypass graft(s), unspecified, with unspecified angina pectoris: Secondary | ICD-10-CM

## 2016-11-18 DIAGNOSIS — R002 Palpitations: Secondary | ICD-10-CM

## 2016-11-18 DIAGNOSIS — E785 Hyperlipidemia, unspecified: Secondary | ICD-10-CM

## 2016-11-18 DIAGNOSIS — IMO0001 Reserved for inherently not codable concepts without codable children: Secondary | ICD-10-CM

## 2016-11-18 MED ORDER — METOPROLOL SUCCINATE ER 25 MG PO TB24: 25.0000 mg | ORAL_TABLET | Freq: Every day | ORAL | 11 refills | Status: DC

## 2016-11-18 MED ORDER — SIMVASTATIN 40 MG PO TABS: 40.0000 mg | ORAL_TABLET | Freq: Every day | ORAL | 11 refills | Status: DC

## 2016-11-18 MED ORDER — EZETIMIBE 10 MG PO TABS: ORAL_TABLET | ORAL | 11 refills | Status: DC

## 2016-11-18 MED ORDER — ASPIRIN EC 81 MG PO TBEC: 81.0000 mg | DELAYED_RELEASE_TABLET | Freq: Every day | ORAL | 3 refills | Status: AC

## 2016-11-18 NOTE — Patient Instructions (Signed)
Medication Instructions: No changes    Procedures/Testing: Your physician has requested that you have an echocardiogram. Echocardiography is a painless test that uses sound waves to create images of your heart. It provides your doctor with information about the size and shape of your heart and how well your heart's chambers and valves are working. This procedure takes approximately one hour. There are no restrictions for this procedure. This will take place at [address], [address]    Follow-Up: Your physician wants you to follow-up in: October with Dr. Trellis Moment will receive a reminder letter in the mail two months in advance. If you don't receive a letter, please call our office to schedule the follow-up appointment.    If you need a refill on your cardiac medications before your next appointment, please call your pharmacy.

## 2016-11-30 ENCOUNTER — Ambulatory Visit (HOSPITAL_COMMUNITY): Payer: BLUE CROSS/BLUE SHIELD | Attending: Cardiovascular Disease

## 2016-11-30 ENCOUNTER — Other Ambulatory Visit: Payer: Self-pay

## 2016-11-30 DIAGNOSIS — I25709 Atherosclerosis of coronary artery bypass graft(s), unspecified, with unspecified angina pectoris: Secondary | ICD-10-CM | POA: Insufficient documentation

## 2016-11-30 DIAGNOSIS — Z951 Presence of aortocoronary bypass graft: Secondary | ICD-10-CM | POA: Diagnosis not present

## 2016-11-30 DIAGNOSIS — R079 Chest pain, unspecified: Secondary | ICD-10-CM

## 2016-11-30 DIAGNOSIS — E119 Type 2 diabetes mellitus without complications: Secondary | ICD-10-CM | POA: Diagnosis not present

## 2016-11-30 DIAGNOSIS — E785 Hyperlipidemia, unspecified: Secondary | ICD-10-CM | POA: Diagnosis not present

## 2016-11-30 DIAGNOSIS — I252 Old myocardial infarction: Secondary | ICD-10-CM | POA: Diagnosis not present

## 2016-12-02 DIAGNOSIS — D225 Melanocytic nevi of trunk: Secondary | ICD-10-CM | POA: Diagnosis not present

## 2016-12-02 DIAGNOSIS — L578 Other skin changes due to chronic exposure to nonionizing radiation: Secondary | ICD-10-CM | POA: Diagnosis not present

## 2016-12-02 DIAGNOSIS — L814 Other melanin hyperpigmentation: Secondary | ICD-10-CM | POA: Diagnosis not present

## 2016-12-03 ENCOUNTER — Telehealth: Payer: Self-pay

## 2016-12-03 DIAGNOSIS — F339 Major depressive disorder, recurrent, unspecified: Secondary | ICD-10-CM

## 2016-12-03 NOTE — Telephone Encounter (Signed)
Referral placed.

## 2016-12-03 NOTE — Telephone Encounter (Signed)
Alejandra Valdez states she is not happy with Dr Celesta Aver and wants a referral to a different psychiatrist. Please advise.   She states she is suffering from depression and would like a change in her medications.        [facility]: Celesta Aver MD  Address: [address] [name], [place] [address] Phone: [phone]

## 2016-12-03 NOTE — Telephone Encounter (Signed)
OK to place new referral.  See if she has a preference. If not can schedule downstairs or in [place] since I think she lives closer.

## 2016-12-11 DIAGNOSIS — F322 Major depressive disorder, single episode, severe without psychotic features: Secondary | ICD-10-CM | POA: Diagnosis not present

## 2016-12-14 DIAGNOSIS — I2510 Atherosclerotic heart disease of native coronary artery without angina pectoris: Secondary | ICD-10-CM | POA: Diagnosis not present

## 2016-12-14 DIAGNOSIS — E1042 Type 1 diabetes mellitus with diabetic polyneuropathy: Secondary | ICD-10-CM | POA: Diagnosis not present

## 2016-12-14 DIAGNOSIS — G43719 Chronic migraine without aura, intractable, without status migrainosus: Secondary | ICD-10-CM | POA: Diagnosis not present

## 2016-12-16 DIAGNOSIS — E109 Type 1 diabetes mellitus without complications: Secondary | ICD-10-CM | POA: Diagnosis not present

## 2016-12-16 DIAGNOSIS — E1065 Type 1 diabetes mellitus with hyperglycemia: Secondary | ICD-10-CM | POA: Diagnosis not present

## 2016-12-24 ENCOUNTER — Ambulatory Visit (INDEPENDENT_AMBULATORY_CARE_PROVIDER_SITE_OTHER): Payer: BLUE CROSS/BLUE SHIELD | Admitting: Sports Medicine

## 2016-12-24 ENCOUNTER — Other Ambulatory Visit: Payer: Self-pay | Admitting: *Deleted

## 2016-12-24 DIAGNOSIS — M7062 Trochanteric bursitis, left hip: Secondary | ICD-10-CM | POA: Diagnosis not present

## 2016-12-24 DIAGNOSIS — M810 Age-related osteoporosis without current pathological fracture: Secondary | ICD-10-CM

## 2016-12-24 NOTE — Progress Notes (Signed)
Pt would like order for bone density.Alejandra Valdez [place]

## 2016-12-24 NOTE — Assessment & Plan Note (Signed)
Right-sided continues to do well, left side is now having pain, over the greater trochanter, injected today per patient request, she'll do the home rehabilitation exercises, return as needed.

## 2016-12-24 NOTE — Progress Notes (Signed)
   Subjective:    I'm seeing this patient as a consultation for:  Dr. Beatrice Lecher  CC: Left hip pain  HPI: This is a pleasant 61 year old female, I did a right trochanteric bursa injection last year some time and she did excellent. Unfortunately she has started to have pain in the left hip, moderate, persistent, localized laterally without radiation. Worse with trying to sleep on the right ipsilateral side. Nothing overtly radicular. She does desire interventional treatment today.  Past medical history:  Negative.  See flowsheet/record as well for more information.  Surgical history: Negative.  See flowsheet/record as well for more information.  Family history: Negative.  See flowsheet/record as well for more information.  Social history: Negative.  See flowsheet/record as well for more information.  Allergies, and medications have been entered into the medical record, reviewed, and no changes needed.   Review of Systems: No headache, visual changes, nausea, vomiting, diarrhea, constipation, dizziness, abdominal pain, skin rash, fevers, chills, night sweats, weight loss, swollen lymph nodes, body aches, joint swelling, muscle aches, chest pain, shortness of breath, mood changes, visual or auditory hallucinations.   Objective:   General: Well Developed, well nourished, and in no acute distress.  Neuro/Psych: Alert and oriented x3, extra-ocular muscles intact, able to move all 4 extremities, sensation grossly intact. Skin: Warm and dry, no rashes noted.  Respiratory: Not using accessory muscles, speaking in full sentences, trachea midline.  Cardiovascular: Pulses palpable, no extremity edema. Abdomen: Does not appear distended. Left Hip: ROM IR: 60 Deg, ER: 60 Deg, Flexion: 120 Deg, Extension: 100 Deg, Abduction: 45 Deg, Adduction: 45 Deg Strength IR: 5/5, ER: 5/5, Flexion: 5/5, Extension: 5/5, Abduction: 5/5, Adduction: 5/5 Pelvic alignment unremarkable to inspection and  palpation. Standing hip rotation and gait without trendelenburg / unsteadiness. Greater trochanter with tenderness to palpation. No tenderness over piriformis. No SI joint tenderness and normal minimal SI movement.  Procedure: Real-time Ultrasound Guided Injection of left greater trochanteric bursa Device: GE Logiq E  Verbal informed consent obtained.  Time-out conducted.  Noted no overlying erythema, induration, or other signs of local infection.  Skin prepped in a sterile fashion.  Local anesthesia: Topical Ethyl chloride.  With sterile technique and under real time ultrasound guidance:  1 mL Kenalog 40, 2 mL lidocaine, 2 mL bupivacaine injected easily Completed without difficulty  Pain immediately resolved suggesting accurate placement of the medication.  Advised to call if fevers/chills, erythema, induration, drainage, or persistent bleeding.  Images permanently stored and available for review in the ultrasound unit.  Impression: Technically successful ultrasound guided injection.  Impression and Recommendations:   This case required medical decision making of moderate complexity.  Greater trochanteric bursitis of left hip Right-sided continues to do well, left side is now having pain, over the greater trochanter, injected today per patient request, she'll do the home rehabilitation exercises, return as needed.

## 2016-12-25 ENCOUNTER — Other Ambulatory Visit: Payer: Self-pay | Admitting: Family Medicine

## 2016-12-25 DIAGNOSIS — Z780 Asymptomatic menopausal state: Secondary | ICD-10-CM

## 2017-01-07 DIAGNOSIS — F322 Major depressive disorder, single episode, severe without psychotic features: Secondary | ICD-10-CM | POA: Diagnosis not present

## 2017-01-08 DIAGNOSIS — F322 Major depressive disorder, single episode, severe without psychotic features: Secondary | ICD-10-CM | POA: Diagnosis not present

## 2017-01-12 ENCOUNTER — Other Ambulatory Visit: Payer: Self-pay | Admitting: Family Medicine

## 2017-01-12 DIAGNOSIS — I25709 Atherosclerosis of coronary artery bypass graft(s), unspecified, with unspecified angina pectoris: Secondary | ICD-10-CM

## 2017-01-15 DIAGNOSIS — E1042 Type 1 diabetes mellitus with diabetic polyneuropathy: Secondary | ICD-10-CM | POA: Diagnosis not present

## 2017-01-15 DIAGNOSIS — G43719 Chronic migraine without aura, intractable, without status migrainosus: Secondary | ICD-10-CM | POA: Diagnosis not present

## 2017-01-15 DIAGNOSIS — I2510 Atherosclerotic heart disease of native coronary artery without angina pectoris: Secondary | ICD-10-CM | POA: Diagnosis not present

## 2017-01-18 DIAGNOSIS — E109 Type 1 diabetes mellitus without complications: Secondary | ICD-10-CM | POA: Diagnosis not present

## 2017-01-28 ENCOUNTER — Ambulatory Visit (INDEPENDENT_AMBULATORY_CARE_PROVIDER_SITE_OTHER): Payer: BLUE CROSS/BLUE SHIELD | Admitting: Psychiatry

## 2017-01-28 ENCOUNTER — Encounter (HOSPITAL_COMMUNITY): Payer: Self-pay | Admitting: Psychiatry

## 2017-01-28 VITALS — BP 128/72 | HR 98 | Resp 16 | Ht 67.0 in | Wt 174.0 lb

## 2017-01-28 DIAGNOSIS — F411 Generalized anxiety disorder: Secondary | ICD-10-CM | POA: Diagnosis not present

## 2017-01-28 DIAGNOSIS — F5102 Adjustment insomnia: Secondary | ICD-10-CM | POA: Diagnosis not present

## 2017-01-28 DIAGNOSIS — F0630 Mood disorder due to known physiological condition, unspecified: Secondary | ICD-10-CM | POA: Diagnosis not present

## 2017-01-28 DIAGNOSIS — Z818 Family history of other mental and behavioral disorders: Secondary | ICD-10-CM | POA: Diagnosis not present

## 2017-01-28 DIAGNOSIS — F331 Major depressive disorder, recurrent, moderate: Secondary | ICD-10-CM | POA: Diagnosis not present

## 2017-01-28 DIAGNOSIS — Z811 Family history of alcohol abuse and dependence: Secondary | ICD-10-CM | POA: Diagnosis not present
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
## 2017-01-28 NOTE — Patient Instructions (Signed)
Recommend therapy.  Patient to work on lowering dose of klonopine and nortryptylline

## 2017-02-08 ENCOUNTER — Other Ambulatory Visit: Payer: Self-pay | Admitting: Sports Medicine

## 2017-02-08 ENCOUNTER — Other Ambulatory Visit: Payer: Self-pay | Admitting: Family Medicine

## 2017-02-08 DIAGNOSIS — M5136 Other intervertebral disc degeneration, lumbar region: Secondary | ICD-10-CM

## 2017-02-08 DIAGNOSIS — M51369 Other intervertebral disc degeneration, lumbar region without mention of lumbar back pain or lower extremity pain: Secondary | ICD-10-CM

## 2017-02-10 ENCOUNTER — Other Ambulatory Visit: Payer: Self-pay | Admitting: Family Medicine

## 2017-02-10 DIAGNOSIS — Z951 Presence of aortocoronary bypass graft: Secondary | ICD-10-CM | POA: Diagnosis not present

## 2017-02-10 DIAGNOSIS — Z0189 Encounter for other specified special examinations: Secondary | ICD-10-CM | POA: Diagnosis not present

## 2017-02-10 DIAGNOSIS — E785 Hyperlipidemia, unspecified: Secondary | ICD-10-CM | POA: Diagnosis not present

## 2017-02-10 DIAGNOSIS — I2510 Atherosclerotic heart disease of native coronary artery without angina pectoris: Secondary | ICD-10-CM | POA: Diagnosis not present

## 2017-02-25 ENCOUNTER — Ambulatory Visit (HOSPITAL_COMMUNITY): Payer: Self-pay | Admitting: Psychiatry

## 2017-03-02 ENCOUNTER — Ambulatory Visit (HOSPITAL_COMMUNITY): Payer: Self-pay | Admitting: Licensed Clinical Social Worker

## 2017-03-19 ENCOUNTER — Other Ambulatory Visit: Payer: Self-pay | Admitting: Family Medicine

## 2017-04-04 ENCOUNTER — Other Ambulatory Visit: Payer: Self-pay | Admitting: Family Medicine

## 2017-04-06 DIAGNOSIS — Z951 Presence of aortocoronary bypass graft: Secondary | ICD-10-CM | POA: Diagnosis not present

## 2017-04-06 DIAGNOSIS — F329 Major depressive disorder, single episode, unspecified: Secondary | ICD-10-CM | POA: Diagnosis not present

## 2017-04-06 DIAGNOSIS — Z9641 Presence of insulin pump (external) (internal): Secondary | ICD-10-CM | POA: Diagnosis not present

## 2017-04-06 DIAGNOSIS — I2510 Atherosclerotic heart disease of native coronary artery without angina pectoris: Secondary | ICD-10-CM | POA: Diagnosis not present

## 2017-04-06 DIAGNOSIS — Z9013 Acquired absence of bilateral breasts and nipples: Secondary | ICD-10-CM | POA: Diagnosis not present

## 2017-04-06 DIAGNOSIS — R531 Weakness: Secondary | ICD-10-CM | POA: Diagnosis not present

## 2017-04-06 DIAGNOSIS — Z853 Personal history of malignant neoplasm of breast: Secondary | ICD-10-CM | POA: Diagnosis not present

## 2017-04-06 DIAGNOSIS — Z8673 Personal history of transient ischemic attack (TIA), and cerebral infarction without residual deficits: Secondary | ICD-10-CM | POA: Diagnosis not present

## 2017-04-06 DIAGNOSIS — R112 Nausea with vomiting, unspecified: Secondary | ICD-10-CM | POA: Diagnosis not present

## 2017-04-06 DIAGNOSIS — R079 Chest pain, unspecified: Secondary | ICD-10-CM | POA: Diagnosis not present

## 2017-04-06 DIAGNOSIS — E039 Hypothyroidism, unspecified: Secondary | ICD-10-CM | POA: Diagnosis not present

## 2017-04-06 DIAGNOSIS — E1010 Type 1 diabetes mellitus with ketoacidosis without coma: Secondary | ICD-10-CM | POA: Diagnosis not present

## 2017-04-06 DIAGNOSIS — Z7982 Long term (current) use of aspirin: Secondary | ICD-10-CM | POA: Diagnosis not present

## 2017-04-07 DIAGNOSIS — E1010 Type 1 diabetes mellitus with ketoacidosis without coma: Secondary | ICD-10-CM | POA: Diagnosis not present

## 2017-04-07 DIAGNOSIS — I2510 Atherosclerotic heart disease of native coronary artery without angina pectoris: Secondary | ICD-10-CM | POA: Diagnosis not present

## 2017-04-07 DIAGNOSIS — E039 Hypothyroidism, unspecified: Secondary | ICD-10-CM | POA: Diagnosis not present

## 2017-04-08 DIAGNOSIS — E1010 Type 1 diabetes mellitus with ketoacidosis without coma: Secondary | ICD-10-CM | POA: Diagnosis not present

## 2017-04-08 DIAGNOSIS — E039 Hypothyroidism, unspecified: Secondary | ICD-10-CM | POA: Diagnosis not present

## 2017-04-08 DIAGNOSIS — I2510 Atherosclerotic heart disease of native coronary artery without angina pectoris: Secondary | ICD-10-CM | POA: Diagnosis not present

## 2017-04-16 DIAGNOSIS — E1042 Type 1 diabetes mellitus with diabetic polyneuropathy: Secondary | ICD-10-CM | POA: Diagnosis not present

## 2017-04-16 DIAGNOSIS — G43719 Chronic migraine without aura, intractable, without status migrainosus: Secondary | ICD-10-CM | POA: Diagnosis not present

## 2017-04-26 DIAGNOSIS — E103593 Type 1 diabetes mellitus with proliferative diabetic retinopathy without macular edema, bilateral: Secondary | ICD-10-CM | POA: Diagnosis not present

## 2017-04-26 DIAGNOSIS — D3131 Benign neoplasm of right choroid: Secondary | ICD-10-CM | POA: Diagnosis not present

## 2017-05-10 DIAGNOSIS — E1065 Type 1 diabetes mellitus with hyperglycemia: Secondary | ICD-10-CM | POA: Diagnosis not present

## 2017-05-10 DIAGNOSIS — E103513 Type 1 diabetes mellitus with proliferative diabetic retinopathy with macular edema, bilateral: Secondary | ICD-10-CM | POA: Diagnosis not present

## 2017-05-10 DIAGNOSIS — E10649 Type 1 diabetes mellitus with hypoglycemia without coma: Secondary | ICD-10-CM | POA: Diagnosis not present

## 2017-05-10 DIAGNOSIS — E109 Type 1 diabetes mellitus without complications: Secondary | ICD-10-CM | POA: Diagnosis not present

## 2017-05-10 DIAGNOSIS — E1043 Type 1 diabetes mellitus with diabetic autonomic (poly)neuropathy: Secondary | ICD-10-CM | POA: Diagnosis not present

## 2017-05-11 DIAGNOSIS — K635 Polyp of colon: Secondary | ICD-10-CM | POA: Insufficient documentation

## 2017-05-11 DIAGNOSIS — R140 Abdominal distension (gaseous): Secondary | ICD-10-CM | POA: Insufficient documentation

## 2017-05-11 DIAGNOSIS — K5909 Other constipation: Secondary | ICD-10-CM | POA: Diagnosis not present

## 2017-05-13 ENCOUNTER — Ambulatory Visit (INDEPENDENT_AMBULATORY_CARE_PROVIDER_SITE_OTHER): Payer: BLUE CROSS/BLUE SHIELD | Admitting: Psychiatry

## 2017-05-13 ENCOUNTER — Encounter (HOSPITAL_COMMUNITY): Payer: Self-pay | Admitting: Psychiatry

## 2017-05-13 ENCOUNTER — Telehealth: Payer: Self-pay | Admitting: Family Medicine

## 2017-05-13 VITALS — BP 110/82 | HR 95 | Ht 67.0 in | Wt 164.0 lb

## 2017-05-13 DIAGNOSIS — F331 Major depressive disorder, recurrent, moderate: Secondary | ICD-10-CM | POA: Diagnosis not present

## 2017-05-13 DIAGNOSIS — F0630 Mood disorder due to known physiological condition, unspecified: Secondary | ICD-10-CM | POA: Diagnosis not present

## 2017-05-13 DIAGNOSIS — F5102 Adjustment insomnia: Secondary | ICD-10-CM

## 2017-05-13 MED ORDER — DESVENLAFAXINE SUCCINATE ER 50 MG PO TB24: 50.0000 mg | ORAL_TABLET | Freq: Every day | ORAL | 5 refills | Status: DC

## 2017-05-13 MED ORDER — IBANDRONATE SODIUM 150 MG PO TABS: ORAL_TABLET | ORAL | 3 refills | Status: DC

## 2017-05-13 MED ORDER — LAMOTRIGINE 100 MG PO TABS: 100.0000 mg | ORAL_TABLET | Freq: Every day | ORAL | 1 refills | Status: AC

## 2017-05-13 MED ORDER — LATUDA 20 MG PO TABS: ORAL_TABLET | ORAL | 1 refills | Status: DC

## 2017-05-13 MED ORDER — VIIBRYD 40 MG PO TABS: 40.0000 mg | ORAL_TABLET | Freq: Every day | ORAL | 1 refills | Status: DC
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
## 2017-05-13 NOTE — Telephone Encounter (Signed)
Patient came in to request refills for her Pristiq and Boniva. She has a followup appointment next week. Please advise. Thanks!

## 2017-05-13 NOTE — Telephone Encounter (Signed)
Refills sent. Please call pt and inform her of this and advise her to be sure to keep her appt thank you.Alejandra Valdez [facility]

## 2017-05-14 NOTE — Telephone Encounter (Signed)
Patient has been informed. Rhonda Cunningham,CMA  

## 2017-05-17 ENCOUNTER — Telehealth (HOSPITAL_COMMUNITY): Payer: Self-pay

## 2017-05-17 ENCOUNTER — Encounter: Payer: Self-pay | Admitting: Family Medicine

## 2017-05-17 ENCOUNTER — Ambulatory Visit (INDEPENDENT_AMBULATORY_CARE_PROVIDER_SITE_OTHER): Payer: BLUE CROSS/BLUE SHIELD | Admitting: Family Medicine

## 2017-05-17 DIAGNOSIS — Z79899 Other long term (current) drug therapy: Secondary | ICD-10-CM

## 2017-05-17 DIAGNOSIS — Z853 Personal history of malignant neoplasm of breast: Secondary | ICD-10-CM

## 2017-05-17 DIAGNOSIS — R0981 Nasal congestion: Secondary | ICD-10-CM

## 2017-05-17 DIAGNOSIS — Z86000 Personal history of in-situ neoplasm of breast: Secondary | ICD-10-CM | POA: Diagnosis not present

## 2017-05-17 DIAGNOSIS — Z951 Presence of aortocoronary bypass graft: Secondary | ICD-10-CM

## 2017-05-17 DIAGNOSIS — E038 Other specified hypothyroidism: Secondary | ICD-10-CM

## 2017-05-17 DIAGNOSIS — R0989 Other specified symptoms and signs involving the circulatory and respiratory systems: Secondary | ICD-10-CM

## 2017-05-17 DIAGNOSIS — Z23 Encounter for immunization: Secondary | ICD-10-CM

## 2017-05-17 NOTE — Progress Notes (Addendum)
Subjective:    Patient ID: Alejandra Valdez, female    DOB: 08-27-1955, 61 y.o.   MRN: [id number]  HPI 61 year old female is here today for follow-up.  Last seen approximately a year ago.  She was recently hospitalized for diabetic ketoacidosis.  She is a type I diabetic. She is followed by Endocrinology for her Diabetes.  She was having problems with her pump.  She says she feels like every couple weeks or something wrong with it.  She has felt a little better since recently starting Amitiza for her bowels.  She says she is going a little bit more than she was previously with but still not having normal bowel movements daily.  She is actually scheduled for colonoscopy and EGD as well as a capsule study.  She would like referral to [facility] in [place].  They now live in [place] and would prefer to not have to drive into [place] if at all possible.  She is also overdue for breast MRI.  She thinks her last one was in [place] about 3 years ago.  She usually gets those instead of a mammogram.  She did have a breast MRI in 2012 over at [place].  Today she also complains of some soreness in both breasts.  She also has a lymph node that she is noticed on the left side of the breast.  Is also had some persistent nasal congestion and cough and mucus in the back of her throat ever since she had surgery.  She would like referral to allergy and immunologist.   Review of Systems  There were no vitals taken for this visit.    Allergies  Allergen Reactions  . Vicodin [Hydrocodone-Acetaminophen]     Past Medical History:  Diagnosis Date  . ADHD (attention deficit hyperactivity disorder)   . Anxiety   . Arthritis    Of spine DDD/DJD  . CAD (coronary artery disease)    a. s/p CABG in 2013 with LIMA-LAD, SVG-RCA, and SVG-OM1-D2  . Cancer [facility]) 2012   Breast  . Depression   . Diabetes mellitus without complication ([place])   . H/O bilateral breast implants 01/24/2016  . History of  colon polyps   . Hypothyroid   . Lung nodule    Stable per CT 05/2013  . Migraine   . Myocardial infarction ([place]) 2013  . Neuropathy   . Osteoporosis     Past Surgical History:  Procedure Laterality Date  . ABDOMINAL HYSTERECTOMY     has left ovary  . APPENDECTOMY    . BILATERAL CARPAL TUNNEL RELEASE    . BLEPHAROPLASTY Bilateral   . CATARACT EXTRACTION Bilateral   . CESAREAN SECTION     x2  . CORONARY ARTERY BYPASS GRAFT  2013   x4  . MASTECTOMY Bilateral 2012  . OTHER SURGICAL HISTORY     Retinal repair  . SHOULDER SURGERY Bilateral     Social History   Socioeconomic History  . Marital status: Married    Spouse name: bobby   . Number of children: 1  . Years of education: Not on file  . Highest education level: Not on file  Social Needs  . Financial resource strain: Not on file  . Food insecurity - worry: Not on file  . Food insecurity - inability: Not on file  . Transportation needs - medical: Not on file  . Transportation needs - non-medical: Not on file  Occupational History  . Occupation: Disabled  Tobacco Use  .  Smoking status: Never Smoker  . Smokeless tobacco: Never Used  Substance and Sexual Activity  . Alcohol use: No    Alcohol/week: 0.0 oz  . Drug use: No  . Sexual activity: No    Partners: Male  Other Topics Concern  . Not on file  Social History Narrative   No daily caffeine intake. Walks for 30 minutes for exercise. She is currently disabled.    Family History  Problem Relation Age of Onset  . Depression Mother   . Alcohol abuse Father   . Hyperlipidemia Father   . Hypertension Father   . Stroke Father   . Alcohol abuse Brother   . Depression Brother   . Hyperlipidemia Brother   . Hypertension Brother   . Cancer Maternal Grandmother   . Heart attack Maternal Grandmother   . Diabetes Maternal Grandfather   . Alcohol abuse Brother   . Depression Brother   . Hyperlipidemia Brother   . Hypertension Brother     Outpatient  Encounter Medications as of 05/17/2017  Medication Sig  . Acetone, Urine, Test (KETONE TEST) STRP by Does not apply route.  Marland Kitchen aspirin EC 81 MG tablet Take 1 tablet (81 mg total) by mouth daily.  Marland Kitchen BAYER CONTOUR NEXT TEST test strip 150 each as needed.  . celecoxib (CELEBREX) 200 MG capsule Take 1 capsule (200 mg total) by mouth daily. *NO ADDITIONAL REFILLS. PLEASE CALL THE OFFICE TO SCHEDULE AN APPOINTMENT*  . cycloSPORINE (RESTASIS) 0.05 % ophthalmic emulsion Place 1 drop into both eyes 2 (two) times daily.  Marland Kitchen ezetimibe (ZETIA) 10 MG tablet TAKE 1 TABLET(10 MG) BY MOUTH DAILY.  Marland Kitchen ibandronate (BONIVA) 150 MG tablet Take one tablet by mouth every 30 days.  . insulin aspart (NOVOLOG) 100 UNIT/ML injection Inject into the skin as needed for high blood sugar.  . lamoTRIgine (LAMICTAL) 100 MG tablet Take 1 tablet (100 mg total) daily by mouth.  Marland Kitchen LATUDA 20 MG TABS tablet TAKE 1 TABLET(20 MG) BY MOUTH AT BEDTIME  . levothyroxine (SYNTHROID, LEVOTHROID) 137 MCG tablet TAKE 1 TABLET(137 MCG) BY MOUTH DAILY BEFORE BREAKFAST  . Liraglutide (VICTOZA) 18 MG/3ML SOPN Inject 3 mLs into the skin 3 (three) times daily.  Marland Kitchen lubiprostone (AMITIZA) 24 MCG capsule Take by mouth.  . meloxicam (MOBIC) 15 MG tablet TAKE 1 TABLET BY MOUTH EVERY MORNING WITH BREAKFAST FOR 2 WEEKS AS NEEDED FOR PAIN  . metoprolol succinate (TOPROL-XL) 25 MG 24 hr tablet Take 1 tablet (25 mg total) by mouth daily.  . naratriptan (AMERGE) 2.5 MG tablet Take 2.5 mg by mouth. Take one tablet at onset of migraine do not take more than 1 a day  . nitroGLYCERIN (NITROSTAT) 0.4 MG SL tablet DISSOLVE 1 TABLET EVERY Q 5 MIN AS NEEDED FOR CHEST PAIN  . nortriptyline (PAMELOR) 10 MG capsule TAKE 1 CAPSULE(10 MG) BY MOUTH THREE TIMES DAILY. FOLLOW UP APPOINTMENT BEFORE MORE REFILLS (Patient taking differently: TAKE 1 CAPSULE(10 MG) BY MOUTH at bedtime)  . omeprazole (PRILOSEC) 40 MG capsule TAKE 1 CAPSULE BY MOUTH EVERY DAY  . simvastatin (ZOCOR) 40  MG tablet Take 1 tablet (40 mg total) by mouth daily at 6 PM.  . VIIBRYD 40 MG TABS Take 1 tablet (40 mg total) daily by mouth.  . [DISCONTINUED] traZODone (DESYREL) 100 MG tablet TAKE 2 TABLETS (200MG ) BY MOUTH AT BEDTIME  . [DISCONTINUED] desvenlafaxine (PRISTIQ) 50 MG 24 hr tablet Take 1 tablet (50 mg total) daily by mouth.  . [DISCONTINUED] DULoxetine (CYMBALTA)  60 MG capsule TAKE 1 CAPSULE(60 MG) BY MOUTH DAILY  . [DISCONTINUED] Prenat Vit-Fe Gly Cys-FA-Omega (ENBRACE HR) CAPS Take 1 capsule by mouth daily. (Patient not taking: Reported on 05/13/2017)  . [DISCONTINUED] Sulfacetamide Sodium-Sulfur (AVAR LS CLEANSER EX) Apply topically.  . [DISCONTINUED] SUMAtriptan Succinate (ONZETRA XSAIL) 11 MG/NOSEPC EXHP Place into the nose.   No facility-administered encounter medications on file as of 05/17/2017.           Objective:   Physical Exam  Constitutional: She is oriented to person, place, and time. She appears well-developed and well-nourished.  HENT:  Head: Normocephalic and atraumatic.  Cardiovascular: Normal rate, regular rhythm and normal heart sounds.  Pulmonary/Chest: Effort normal and breath sounds normal.  Neurological: She is alert and oriented to person, place, and time.  Skin: Skin is warm and dry.  Psychiatric: She has a normal mood and affect. Her behavior is normal.          Assessment & Plan:   Coronary artery disease status post CABG x4-we will place new referral cardiology.  Due for screening lipids and CMP.  Chronic constipation-doing a little bit better with the addition of Amitiza.  Type 1 diabetes-followed by endocrinology.  Hypothyroidism-due to recheck TSH.  Runny nose with postnasal drip-refer to allergy and immunology for possible allergy testing.  Cancer screening-she reports that she always got breast MRIs done.  Last was done about 3 years ago in [place].  I do not have any copy of her records but she has had a history of bilateral mastectomy  with implants.  Will place referral to the breast center for consultation in regards to best test option.  Also concerned that she is having some pain.  This could be from recurrence or even possibly from a leakage from her implants.

## 2017-05-17 NOTE — Telephone Encounter (Signed)
Patient called stating that her Trazodone was last prescribed by her psychiatrist Dr. Ruthann Cancer. She states that she needs a refill on the Trazodone. Her last visit was on 05/13/17, next visit is on 07/05/17. Please review and advise.

## 2017-05-17 NOTE — Addendum Note (Signed)
Addended by: Teddy Spike on: 05/17/2017 06:01 PM   Modules accepted: Orders

## 2017-05-17 NOTE — Telephone Encounter (Signed)
Patient also called to state the dr Madilyn Fireman discontinued duloxetine, and cymbalta.

## 2017-05-18 ENCOUNTER — Telehealth: Payer: Self-pay | Admitting: Family Medicine

## 2017-05-18 DIAGNOSIS — F5104 Psychophysiologic insomnia: Secondary | ICD-10-CM

## 2017-05-18 DIAGNOSIS — Z86000 Personal history of in-situ neoplasm of breast: Secondary | ICD-10-CM | POA: Insufficient documentation

## 2017-05-18 MED ORDER — TRAZODONE HCL 100 MG PO TABS: 100.0000 mg | ORAL_TABLET | Freq: Every day | ORAL | 0 refills | Status: DC

## 2017-05-18 MED ORDER — TRAZODONE HCL 100 MG PO TABS: 200.0000 mg | ORAL_TABLET | Freq: Every day | ORAL | 1 refills | Status: DC

## 2017-05-18 NOTE — Telephone Encounter (Signed)
rx sent.Alejandra Valdez  

## 2017-05-18 NOTE — Telephone Encounter (Signed)
Per the patient, she has tried to avoiding naps in the daytime. She states she needs the Trazodone that she has been on, which is Trazodone 100mg , 2 tabs at bedtime. She was very upset about not getting the Trazodone refilled. Please review and advise.

## 2017-05-18 NOTE — Telephone Encounter (Signed)
Per Dr. De Nurse, Trazodone 100mg  qhs was sent to pharmacy. Patient notified that she was not going to get the dosage she wanted. She was still upset. Nothing further is required.

## 2017-05-18 NOTE — Telephone Encounter (Signed)
Routing to Provider for review.  

## 2017-05-18 NOTE — Addendum Note (Signed)
Addended by: Beatrice Lecher D on: 05/18/2017 12:13 PM   Modules accepted: Orders

## 2017-05-18 NOTE — Telephone Encounter (Signed)
Patient called advised she has spoken to you about needing a script for Trazodone 200mg . Her other Dr. Did not write her a script for it and she needs one written. Told the other office she was calling her primary care and that she was very disappointed in their office the Dr. Was only willing to write 100 mg and she has been having problems with that office.

## 2017-05-18 NOTE — Telephone Encounter (Signed)
trazadone 200mg  is high dose.  Send 100mg  ok and should avoid using high dose.

## 2017-05-18 NOTE — Telephone Encounter (Signed)
We have talked about sleep hygiene when she was her to avoid day naps. That will help her sleep.  Call back after if need and trazadone dose not on file . If need we can discuss 50mg  HS

## 2017-05-19 DIAGNOSIS — E119 Type 2 diabetes mellitus without complications: Secondary | ICD-10-CM | POA: Diagnosis not present

## 2017-05-19 DIAGNOSIS — L602 Onychogryphosis: Secondary | ICD-10-CM | POA: Diagnosis not present

## 2017-05-19 DIAGNOSIS — L603 Nail dystrophy: Secondary | ICD-10-CM | POA: Diagnosis not present

## 2017-05-24 DIAGNOSIS — E1043 Type 1 diabetes mellitus with diabetic autonomic (poly)neuropathy: Secondary | ICD-10-CM | POA: Diagnosis not present

## 2017-05-24 DIAGNOSIS — K3184 Gastroparesis: Secondary | ICD-10-CM | POA: Diagnosis not present

## 2017-05-24 DIAGNOSIS — R911 Solitary pulmonary nodule: Secondary | ICD-10-CM | POA: Diagnosis not present

## 2017-05-24 DIAGNOSIS — Z951 Presence of aortocoronary bypass graft: Secondary | ICD-10-CM | POA: Diagnosis not present

## 2017-05-24 DIAGNOSIS — E039 Hypothyroidism, unspecified: Secondary | ICD-10-CM | POA: Diagnosis not present

## 2017-05-24 DIAGNOSIS — E1065 Type 1 diabetes mellitus with hyperglycemia: Secondary | ICD-10-CM | POA: Diagnosis not present

## 2017-05-24 DIAGNOSIS — T43215A Adverse effect of selective serotonin and norepinephrine reuptake inhibitors, initial encounter: Secondary | ICD-10-CM | POA: Diagnosis not present

## 2017-05-24 DIAGNOSIS — R0602 Shortness of breath: Secondary | ICD-10-CM | POA: Diagnosis not present

## 2017-05-24 DIAGNOSIS — R7989 Other specified abnormal findings of blood chemistry: Secondary | ICD-10-CM | POA: Diagnosis not present

## 2017-05-24 DIAGNOSIS — I2510 Atherosclerotic heart disease of native coronary artery without angina pectoris: Secondary | ICD-10-CM | POA: Diagnosis not present

## 2017-05-24 DIAGNOSIS — Z794 Long term (current) use of insulin: Secondary | ICD-10-CM | POA: Diagnosis not present

## 2017-05-24 DIAGNOSIS — E785 Hyperlipidemia, unspecified: Secondary | ICD-10-CM | POA: Diagnosis not present

## 2017-05-24 DIAGNOSIS — E103513 Type 1 diabetes mellitus with proliferative diabetic retinopathy with macular edema, bilateral: Secondary | ICD-10-CM | POA: Diagnosis not present

## 2017-05-24 DIAGNOSIS — F349 Persistent mood [affective] disorder, unspecified: Secondary | ICD-10-CM | POA: Diagnosis not present

## 2017-05-24 DIAGNOSIS — R079 Chest pain, unspecified: Secondary | ICD-10-CM | POA: Diagnosis not present

## 2017-05-24 DIAGNOSIS — K449 Diaphragmatic hernia without obstruction or gangrene: Secondary | ICD-10-CM | POA: Diagnosis not present

## 2017-05-24 DIAGNOSIS — E1069 Type 1 diabetes mellitus with other specified complication: Secondary | ICD-10-CM | POA: Diagnosis not present

## 2017-05-24 DIAGNOSIS — R140 Abdominal distension (gaseous): Secondary | ICD-10-CM | POA: Diagnosis not present

## 2017-05-24 DIAGNOSIS — K5909 Other constipation: Secondary | ICD-10-CM | POA: Diagnosis not present

## 2017-05-25 DIAGNOSIS — I2510 Atherosclerotic heart disease of native coronary artery without angina pectoris: Secondary | ICD-10-CM | POA: Diagnosis not present

## 2017-05-25 DIAGNOSIS — R079 Chest pain, unspecified: Secondary | ICD-10-CM | POA: Diagnosis not present

## 2017-05-25 DIAGNOSIS — R140 Abdominal distension (gaseous): Secondary | ICD-10-CM | POA: Diagnosis not present

## 2017-05-25 DIAGNOSIS — E039 Hypothyroidism, unspecified: Secondary | ICD-10-CM | POA: Diagnosis not present

## 2017-06-08 DIAGNOSIS — L97511 Non-pressure chronic ulcer of other part of right foot limited to breakdown of skin: Secondary | ICD-10-CM | POA: Diagnosis not present

## 2017-06-08 DIAGNOSIS — I240 Acute coronary thrombosis not resulting in myocardial infarction: Secondary | ICD-10-CM | POA: Diagnosis not present

## 2017-06-08 DIAGNOSIS — M79674 Pain in right toe(s): Secondary | ICD-10-CM | POA: Diagnosis not present

## 2017-06-08 DIAGNOSIS — Z853 Personal history of malignant neoplasm of breast: Secondary | ICD-10-CM | POA: Diagnosis not present

## 2017-06-08 DIAGNOSIS — E1065 Type 1 diabetes mellitus with hyperglycemia: Secondary | ICD-10-CM | POA: Diagnosis not present

## 2017-06-14 DIAGNOSIS — N644 Mastodynia: Secondary | ICD-10-CM | POA: Diagnosis not present

## 2017-06-14 DIAGNOSIS — Z86000 Personal history of in-situ neoplasm of breast: Secondary | ICD-10-CM | POA: Diagnosis not present

## 2017-06-14 DIAGNOSIS — Z853 Personal history of malignant neoplasm of breast: Secondary | ICD-10-CM | POA: Diagnosis not present

## 2017-06-14 DIAGNOSIS — Z803 Family history of malignant neoplasm of breast: Secondary | ICD-10-CM | POA: Diagnosis not present

## 2017-06-14 DIAGNOSIS — R928 Other abnormal and inconclusive findings on diagnostic imaging of breast: Secondary | ICD-10-CM | POA: Diagnosis not present

## 2017-06-15 DIAGNOSIS — Z9641 Presence of insulin pump (external) (internal): Secondary | ICD-10-CM | POA: Diagnosis not present

## 2017-06-15 DIAGNOSIS — R531 Weakness: Secondary | ICD-10-CM | POA: Diagnosis not present

## 2017-06-15 DIAGNOSIS — E785 Hyperlipidemia, unspecified: Secondary | ICD-10-CM | POA: Diagnosis not present

## 2017-06-15 DIAGNOSIS — I071 Rheumatic tricuspid insufficiency: Secondary | ICD-10-CM | POA: Diagnosis not present

## 2017-06-15 DIAGNOSIS — I959 Hypotension, unspecified: Secondary | ICD-10-CM | POA: Diagnosis not present

## 2017-06-15 DIAGNOSIS — R401 Stupor: Secondary | ICD-10-CM | POA: Diagnosis not present

## 2017-06-15 DIAGNOSIS — R410 Disorientation, unspecified: Secondary | ICD-10-CM | POA: Diagnosis not present

## 2017-06-15 DIAGNOSIS — Z794 Long term (current) use of insulin: Secondary | ICD-10-CM | POA: Diagnosis not present

## 2017-06-15 DIAGNOSIS — N179 Acute kidney failure, unspecified: Secondary | ICD-10-CM | POA: Diagnosis not present

## 2017-06-15 DIAGNOSIS — E1165 Type 2 diabetes mellitus with hyperglycemia: Secondary | ICD-10-CM | POA: Diagnosis not present

## 2017-06-15 DIAGNOSIS — I10 Essential (primary) hypertension: Secondary | ICD-10-CM | POA: Diagnosis not present

## 2017-06-15 DIAGNOSIS — E1039 Type 1 diabetes mellitus with other diabetic ophthalmic complication: Secondary | ICD-10-CM | POA: Diagnosis not present

## 2017-06-15 DIAGNOSIS — R4182 Altered mental status, unspecified: Secondary | ICD-10-CM | POA: Diagnosis not present

## 2017-06-15 DIAGNOSIS — E1010 Type 1 diabetes mellitus with ketoacidosis without coma: Secondary | ICD-10-CM | POA: Diagnosis not present

## 2017-06-15 DIAGNOSIS — R739 Hyperglycemia, unspecified: Secondary | ICD-10-CM | POA: Diagnosis not present

## 2017-06-15 DIAGNOSIS — I2510 Atherosclerotic heart disease of native coronary artery without angina pectoris: Secondary | ICD-10-CM | POA: Diagnosis not present

## 2017-06-16 DIAGNOSIS — E1010 Type 1 diabetes mellitus with ketoacidosis without coma: Secondary | ICD-10-CM | POA: Diagnosis not present

## 2017-06-17 DIAGNOSIS — N179 Acute kidney failure, unspecified: Secondary | ICD-10-CM | POA: Diagnosis not present

## 2017-06-17 DIAGNOSIS — E1010 Type 1 diabetes mellitus with ketoacidosis without coma: Secondary | ICD-10-CM | POA: Diagnosis not present

## 2017-06-17 DIAGNOSIS — I071 Rheumatic tricuspid insufficiency: Secondary | ICD-10-CM | POA: Diagnosis not present

## 2017-06-17 DIAGNOSIS — I959 Hypotension, unspecified: Secondary | ICD-10-CM | POA: Diagnosis not present

## 2017-06-26 ENCOUNTER — Other Ambulatory Visit: Payer: Self-pay | Admitting: Family Medicine

## 2017-06-28 ENCOUNTER — Telehealth: Payer: Self-pay

## 2017-06-28 NOTE — Telephone Encounter (Signed)
Alejandra Valdez came in to the office today to let Dr. Madilyn Fireman know what's been going on with his wife. Alejandra Valdez has been in the hospital [facility]) twice this past month due to diabetic complications. When she was there the last time they found a leak in her heart and it's causing her to be very fatigued. Her spouse would like to talk to you about everything going on with her and see if she can get an earlier appointment with the specialist. Cardiologist Dr. Salvadore Oxford appt is Feb 14th, Endocrinologist Dr. Maia Petties appt is Feb 4th. He is concerned that these things need to be addressed sooner than these appts.

## 2017-06-28 NOTE — Telephone Encounter (Signed)
Routing to PCP

## 2017-06-30 NOTE — Telephone Encounter (Signed)
Spoke with Alejandra Valdez, advised that PCP is going to try and get in touch with his this afternoon regarding Avacyn. He would prefer to be contacted on his cell: [phone].

## 2017-06-30 NOTE — Telephone Encounter (Signed)
Please call cardiology with Dr. Berline Lopes and see if they might be able to get her in a little bit sooner than February 14.  Also please call Dr. Maia Petties, endocrinology and see if they can get her in a little bit sooner than February 4 after her recent hospitalization.  I spoke with husband and just let him know that we would try to do our best to see if we could get them worked and just a little bit sooner.  Beatrice Lecher, MD

## 2017-06-30 NOTE — Telephone Encounter (Signed)
Spoke with office of Dr. Salvadore Oxford (Cardiology) and there are no openings sooner than her 2/14 appointment. Spoke with the office of Dr. Maia Petties (Endo) and moved appt up to 1/7 at 1300.   Pt's husband, Mortimer Fries, advised. She has a colonoscopy scheduled for 1/7, so he will have to move that appointment.

## 2017-06-30 NOTE — Telephone Encounter (Signed)
Alejandra Valdez, We you please call Mr. aErp and let him know that I will try to give them a call this afternoon.  Just routed back to me when you are done.

## 2017-07-01 DIAGNOSIS — I361 Nonrheumatic tricuspid (valve) insufficiency: Secondary | ICD-10-CM | POA: Diagnosis not present

## 2017-07-01 DIAGNOSIS — I951 Orthostatic hypotension: Secondary | ICD-10-CM | POA: Diagnosis not present

## 2017-07-01 DIAGNOSIS — Z951 Presence of aortocoronary bypass graft: Secondary | ICD-10-CM | POA: Diagnosis not present

## 2017-07-01 DIAGNOSIS — I2510 Atherosclerotic heart disease of native coronary artery without angina pectoris: Secondary | ICD-10-CM | POA: Diagnosis not present

## 2017-07-02 DIAGNOSIS — R140 Abdominal distension (gaseous): Secondary | ICD-10-CM | POA: Diagnosis not present

## 2017-07-02 DIAGNOSIS — R6881 Early satiety: Secondary | ICD-10-CM | POA: Diagnosis not present

## 2017-07-02 DIAGNOSIS — R110 Nausea: Secondary | ICD-10-CM | POA: Diagnosis not present

## 2017-07-04 ENCOUNTER — Other Ambulatory Visit (HOSPITAL_COMMUNITY): Payer: Self-pay | Admitting: Psychiatry

## 2017-07-05 ENCOUNTER — Ambulatory Visit (HOSPITAL_COMMUNITY): Payer: Self-pay | Admitting: Psychiatry

## 2017-07-05 DIAGNOSIS — Z1211 Encounter for screening for malignant neoplasm of colon: Secondary | ICD-10-CM | POA: Diagnosis not present

## 2017-07-05 LAB — HM COLONOSCOPY

## 2017-07-07 ENCOUNTER — Other Ambulatory Visit (HOSPITAL_COMMUNITY): Payer: Self-pay | Admitting: Psychiatry

## 2017-07-12 ENCOUNTER — Encounter: Payer: Self-pay | Admitting: Family Medicine

## 2017-07-19 ENCOUNTER — Ambulatory Visit (HOSPITAL_COMMUNITY): Payer: BLUE CROSS/BLUE SHIELD | Admitting: Psychiatry

## 2017-07-24 ENCOUNTER — Other Ambulatory Visit (HOSPITAL_COMMUNITY): Payer: Self-pay | Admitting: Psychiatry

## 2017-07-26 DIAGNOSIS — I10 Essential (primary) hypertension: Secondary | ICD-10-CM | POA: Diagnosis not present

## 2017-07-26 DIAGNOSIS — E1065 Type 1 diabetes mellitus with hyperglycemia: Secondary | ICD-10-CM | POA: Diagnosis not present

## 2017-07-26 DIAGNOSIS — M79674 Pain in right toe(s): Secondary | ICD-10-CM | POA: Diagnosis not present

## 2017-07-26 DIAGNOSIS — L97511 Non-pressure chronic ulcer of other part of right foot limited to breakdown of skin: Secondary | ICD-10-CM | POA: Diagnosis not present

## 2017-07-26 DIAGNOSIS — I2510 Atherosclerotic heart disease of native coronary artery without angina pectoris: Secondary | ICD-10-CM | POA: Diagnosis not present

## 2017-07-28 ENCOUNTER — Telehealth: Payer: Self-pay | Admitting: *Deleted

## 2017-07-28 DIAGNOSIS — F339 Major depressive disorder, recurrent, unspecified: Secondary | ICD-10-CM

## 2017-07-28 DIAGNOSIS — R1013 Epigastric pain: Secondary | ICD-10-CM

## 2017-07-28 NOTE — Telephone Encounter (Signed)
Pt was with her husband and requested referrals for GI and Psychiatry. Referrals placed.Maryruth Eve, Lahoma Crocker, CMA

## 2017-07-30 ENCOUNTER — Encounter: Payer: Self-pay | Admitting: *Deleted

## 2017-07-30 ENCOUNTER — Ambulatory Visit
Admission: EM | Admit: 2017-07-30 | Discharge: 2017-07-30 | Disposition: A | Discharge status: Home / Self Care | Payer: BLUE CROSS/BLUE SHIELD | Attending: Family Medicine | Admitting: Family Medicine

## 2017-07-30 DIAGNOSIS — J069 Acute upper respiratory infection, unspecified: Secondary | ICD-10-CM

## 2017-07-30 LAB — BASIC METABOLIC PANEL WITH GFR
Anion gap: 6 (ref 5–15)
BUN: 25 mg/dL — ABNORMAL HIGH (ref 6–20)
CO2: 25 mmol/L (ref 22–32)
Calcium: 8.7 mg/dL — ABNORMAL LOW (ref 8.9–10.3)
Chloride: 107 mmol/L (ref 101–111)
Creatinine, Ser: 0.85 mg/dL (ref 0.44–1.00)
GFR calc Af Amer: >60 mL/min
GFR calc non Af Amer: >60 mL/min
Glucose, Bld: 150 mg/dL — ABNORMAL HIGH (ref 65–99)
Potassium: 3.6 mmol/L (ref 3.5–5.1)
Sodium: 138 mmol/L (ref 135–145)

## 2017-07-30 LAB — RAPID STREP SCREEN (MED CTR MEBANE ONLY): STREPTOCOCCUS, GROUP A SCREEN (DIRECT): NEGATIVE

## 2017-07-30 LAB — RAPID INFLUENZA A&B ANTIGENS
Influenza A (ARMC): NEGATIVE
Influenza B (ARMC): NEGATIVE

## 2017-07-30 MED ORDER — AMOXICILLIN-POT CLAVULANATE 875-125 MG PO TABS: 1.0000 | ORAL_TABLET | Freq: Two times a day (BID) | ORAL | 0 refills | Status: DC

## 2017-07-30 NOTE — ED Triage Notes (Signed)
C/O nasal congestion, headache, left eye drainage, productive cough and diarrhea for two days. Pt is diabetic and her BS has been in the 300's .

## 2017-07-30 NOTE — ED Provider Notes (Signed)
[facility]    CSN: [id number] Arrival date & time: 07/30/17  1813  History   Chief Complaint Chief Complaint  Patient presents with  . Nasal Congestion   HPI  62 year old female presents with respiratory symptoms.  Patient states that she has not been feeling well for the past 2 days. She states that her husband has been sick as well.  He is currently on antibiotic therapy.  She states that she has had a 2-day history of sore throat, headache, productive cough, dry mouth.  She has also had recent diarrhea and crusting of the left eye.  She states that she is generally not feeling well.  She does report body aches but has had no fever.  She states that her glucose levels have been quite high secondary to infection.  No known exacerbating relieving factors.  No other reported symptoms.  No other complaints this time.  Past Medical History:  Diagnosis Date  . ADHD (attention deficit hyperactivity disorder)   . Anxiety   . Arthritis    Of spine DDD/DJD  . CAD (coronary artery disease)    a. s/p CABG in 2013 with LIMA-LAD, SVG-RCA, and SVG-OM1-D2  . Cancer [facility]) 2012   Breast  . Depression   . Diabetes mellitus without complication ([place])   . H/O bilateral breast implants 01/24/2016  . History of colon polyps   . Hypothyroid   . Lung nodule    Stable per CT 05/2013  . Migraine   . Myocardial infarction ([place]) 2013  . Neuropathy   . Osteoporosis    Patient Active Problem List   Diagnosis Date Noted  . History of ductal carcinoma in situ (DCIS) of breast 05/18/2017  . Bloating symptom 05/11/2017  . Colon polyp 05/11/2017  . Greater trochanteric bursitis of left hip 12/24/2016  . Coronary artery disease involving coronary bypass graft of native heart with angina pectoris ([place]) 11/18/2016  . Chest pain 11/18/2016  . DDD (degenerative disc disease), cervical 10/16/2016  . Presence of insulin pump 07/19/2016  . Dyslipidemia due to type 1 diabetes mellitus ([place])  07/19/2016  . Primary osteoarthritis of left knee 05/28/2016  . Greater trochanteric bursitis, right 05/28/2016  . H/O bilateral mastectomy 01/24/2016  . H/O bilateral breast implants 01/24/2016  . Aortic sclerosis 01/02/2016  . Personal history of multiple pulmonary nodules 07/25/2015  . Pericardial effusion 07/25/2015  . CAD (coronary artery disease) 07/24/2015  . Cognitive dysfunction 04/09/2015  . Osteoporosis 04/09/2015  . Hypothyroidism 04/09/2015  . Hyperlipidemia 04/09/2015  . DDD (degenerative disc disease), lumbar 04/09/2015  . Lung nodule 04/09/2015  . History of breast cancer 04/09/2015  . S/P CABG x 4 04/08/2015  . Chronic constipation 04/08/2015  . Fibromyalgia 04/08/2015  . Diabetes type 1, controlled ([place]) 04/08/2015  . Hereditary and idiopathic peripheral neuropathy 04/08/2015  . Persistent mood (affective) disorder, unspecified ([place]) 04/08/2015  . Diabetic retinopathy ([place]) 04/08/2015  . Chronic insomnia 04/08/2015  . DKA (diabetic ketoacidoses) ([place]) 09/25/2013  . Polypharmacy 02/01/2012  . Myalgia 02/01/2012  . Fracture 02/01/2012  . Fatigue 02/01/2012  . Uncontrolled type 1 diabetes mellitus with ophthalmic complication ([place]) 94/17/4081  . Vitamin D deficiency 10/21/2009  . Attention deficit hyperactivity disorder (ADHD) 10/17/2009    Past Surgical History:  Procedure Laterality Date  . ABDOMINAL HYSTERECTOMY     has left ovary  . APPENDECTOMY    . BILATERAL CARPAL TUNNEL RELEASE    . BLEPHAROPLASTY Bilateral   . CATARACT EXTRACTION Bilateral   .  CESAREAN SECTION     x2  . CORONARY ARTERY BYPASS GRAFT  2013   x4  . MASTECTOMY Bilateral 2012  . OTHER SURGICAL HISTORY     Retinal repair  . SHOULDER SURGERY Bilateral     OB History    No data available     Home Medications    Prior to Admission medications   Medication Sig Start Date End Date Taking? Authorizing Provider  Acetone, Urine, Test (KETONE TEST) STRP by Does not apply route.  07/20/16  Yes [provider]  aspirin EC 81 MG tablet Take 1 tablet (81 mg total) by mouth daily. 11/18/16  Yes Strader, Fransisco Hertz, PA-C  BAYER CONTOUR NEXT TEST test strip 150 each as needed. 03/23/15  Yes [provider]  celecoxib (CELEBREX) 200 MG capsule Take 1 capsule (200 mg total) by mouth daily. *NO ADDITIONAL REFILLS. PLEASE CALL THE OFFICE TO SCHEDULE AN APPOINTMENT* 11/28/15  Yes Hali Marry, MD  cycloSPORINE (RESTASIS) 0.05 % ophthalmic emulsion Place 1 drop into both eyes 2 (two) times daily. 04/17/15  Yes Hali Marry, MD  ezetimibe (ZETIA) 10 MG tablet TAKE 1 TABLET(10 MG) BY MOUTH DAILY. 11/18/16  Yes Strader, Fransisco Hertz, PA-C  amoxicillin-clavulanate (AUGMENTIN) 875-125 MG tablet Take 1 tablet by mouth every 12 (twelve) hours. 07/30/17   Coral Spikes, DO  ibandronate (BONIVA) 150 MG tablet Take one tablet by mouth every 30 days. 05/13/17   Hali Marry, MD  insulin aspart (NOVOLOG) 100 UNIT/ML injection Inject into the skin as needed for high blood sugar.    [provider]  lamoTRIgine (LAMICTAL) 100 MG tablet Take 1 tablet (100 mg total) daily by mouth. 05/13/17   Merian Capron, MD  LATUDA 20 MG TABS tablet TAKE 1 TABLET(20 MG) BY MOUTH AT BEDTIME 05/13/17   Merian Capron, MD  levothyroxine (SYNTHROID, LEVOTHROID) 137 MCG tablet TAKE 1 TABLET(137 MCG) BY MOUTH DAILY BEFORE BREAKFAST 07/28/16   Hali Marry, MD  Liraglutide (VICTOZA) 18 MG/3ML SOPN Inject 3 mLs into the skin 3 (three) times daily.    [provider]  meloxicam (MOBIC) 15 MG tablet TAKE 1 TABLET BY MOUTH EVERY MORNING WITH BREAKFAST FOR 2 WEEKS AS NEEDED FOR PAIN 02/09/17   Silverio Decamp, MD  metoprolol succinate (TOPROL-XL) 25 MG 24 hr tablet Take 1 tablet (25 mg total) by mouth daily. 11/18/16   Strader, Fransisco Hertz, PA-C  naratriptan (AMERGE) 2.5 MG tablet Take 2.5 mg by mouth. Take one tablet at onset of migraine do not take more than 1 a  day 02/08/15   [provider]  nitroGLYCERIN (NITROSTAT) 0.4 MG SL tablet DISSOLVE 1 TABLET EVERY Q 5 MIN AS NEEDED FOR CHEST PAIN 02/10/17   [provider]  nortriptyline (PAMELOR) 10 MG capsule TAKE 1 CAPSULE(10 MG) BY MOUTH THREE TIMES DAILY. FOLLOW UP APPOINTMENT BEFORE MORE REFILLS Patient taking differently: TAKE 1 CAPSULE(10 MG) BY MOUTH at bedtime 08/12/15   Hali Marry, MD  omeprazole (PRILOSEC) 40 MG capsule TAKE 1 CAPSULE BY MOUTH EVERY DAY 06/28/17   Hali Marry, MD  simvastatin (ZOCOR) 40 MG tablet Take 1 tablet (40 mg total) by mouth daily at 6 PM. 11/18/16   Strader, Fransisco Hertz, PA-C  traZODone (DESYREL) 100 MG tablet Take 2 tablets (200 mg total) by mouth at bedtime. 05/18/17   Hali Marry, MD  VIIBRYD 40 MG TABS Take 1 tablet (40 mg total) daily by mouth. 05/13/17   Merian Capron,  MD  clonazePAM (KLONOPIN) 1 MG tablet Take 2 mg by mouth daily as needed.  03/14/15 05/13/17  [provider]  temazepam (RESTORIL) 15 MG capsule TK ONE C PO QHS 08/12/15 05/13/17  [provider]    Family History Family History  Problem Relation Age of Onset  . Depression Mother   . Alcohol abuse Father   . Hyperlipidemia Father   . Hypertension Father   . Stroke Father   . Alcohol abuse Brother   . Depression Brother   . Hyperlipidemia Brother   . Hypertension Brother   . Cancer Maternal Grandmother   . Heart attack Maternal Grandmother   . Diabetes Maternal Grandfather   . Alcohol abuse Brother   . Depression Brother   . Hyperlipidemia Brother   . Hypertension Brother     Social History Social History   Tobacco Use  . Smoking status: Never Smoker  . Smokeless tobacco: Never Used  Substance Use Topics  . Alcohol use: No    Alcohol/week: 0.0 oz  . Drug use: No     Allergies   Vicodin [hydrocodone-acetaminophen]   Review of Systems Review of Systems  Constitutional: Positive for fatigue. Negative for fever.    HENT: Positive for congestion and sore throat.   Eyes: Positive for redness.  Respiratory: Positive for cough.   Gastrointestinal: Positive for diarrhea.  Neurological: Positive for headaches.   Physical Exam Triage Vital Signs ED Triage Vitals  Enc Vitals Group     BP 07/30/17 1841 (!) 98/36     Pulse Rate 07/30/17 1841 (!) 103     Resp 07/30/17 1841 16     Temp 07/30/17 1841 98.3 F (36.8 C)     Temp Source 07/30/17 1841 Oral     SpO2 07/30/17 1841 99 %     Weight 07/30/17 1845 156 lb (70.8 kg)     Height 07/30/17 1845 5\' 7"  (1.702 m)     Head Circumference --      Peak Flow --      Pain Score 07/30/17 1845 4     Pain Loc --      Pain Edu? --      Excl. in [place]? --    Updated Vital Signs BP (!) 98/36 (BP Location: Left Arm)   Pulse (!) 103   Temp 98.3 F (36.8 C) (Oral)   Resp 16   Ht 5\' 7"  (1.702 m)   Wt 156 lb (70.8 kg)   SpO2 99%   BMI 24.43 kg/m    Physical Exam  Constitutional: She is oriented to person, place, and time. She appears well-developed and well-nourished. No distress.  HENT:  Head: Normocephalic and atraumatic.  Oropharynx clear.  Dry.  Eyes: Pupils are equal, round, and reactive to light.  Conjunctival injection.  No drainage.  No crusting.  Neck: Neck supple.  Cardiovascular: Regular rhythm.  Tachycardic.  Pulmonary/Chest: Effort normal and breath sounds normal. She has no wheezes. She has no rales.  Lymphadenopathy:    She has no cervical adenopathy.  Neurological: She is alert and oriented to person, place, and time.  Psychiatric: She has a normal mood and affect. Her behavior is normal.  Nursing note and vitals reviewed.  UC Treatments / Results  Labs (all labs ordered are listed, but only abnormal results are displayed) Labs Reviewed  BASIC METABOLIC PANEL - Abnormal; Notable for the following components:      Result Value   Glucose, Bld 150 (*)  BUN 25 (*)    Calcium 8.7 (*)    All other components within normal limits   RAPID STREP SCREEN (NOT AT [facility])  RAPID INFLUENZA A&B ANTIGENS ([facility] ONLY)  CULTURE, GROUP A STREP [facility])    EKG  EKG Interpretation None       Radiology No results found.  Procedures Procedures (including critical care time)  Medications Ordered in UC Medications - No data to display   Initial Impression / Assessment and Plan / UC Course  I have reviewed the triage vital signs and the nursing notes.  Pertinent labs & imaging results that were available during my care of the patient were reviewed by me and considered in my medical decision making (see chart for details).     62 year old female presents with an upper respiratory infection.  Given comorbidities and recent exposure and the fact that her husband is on antibiotic therapy, patient desires antibiotic treatment.  I think this is reasonable and will proceed with antibiotic.  Labs were obtained today to assess for influenza which was negative and to address her blood glucose and kidney function given elevated blood glucose readings.  Final Clinical Impressions(s) / UC Diagnoses   Final diagnoses:  Upper respiratory tract infection, unspecified type    ED Discharge Orders        Ordered    amoxicillin-clavulanate (AUGMENTIN) 875-125 MG tablet  Every 12 hours     07/30/17 2007     Controlled Substance Prescriptions [place] Controlled Substance Registry consulted? Not Applicable   Coral Spikes, DO 07/30/17 2027

## 2017-07-30 NOTE — Discharge Instructions (Signed)
Tylenol as needed.  Medication as prescribed.  Take care  Dr. Lacinda Axon

## 2017-08-02 ENCOUNTER — Other Ambulatory Visit: Payer: Self-pay

## 2017-08-02 ENCOUNTER — Emergency Department
Admission: EM | Admit: 2017-08-02 | Discharge: 2017-08-02 | Disposition: A | Discharge status: Home / Self Care | Payer: BLUE CROSS/BLUE SHIELD | Source: Home / Self Care | Attending: Family Medicine | Admitting: Family Medicine

## 2017-08-02 DIAGNOSIS — I2510 Atherosclerotic heart disease of native coronary artery without angina pectoris: Secondary | ICD-10-CM | POA: Diagnosis not present

## 2017-08-02 DIAGNOSIS — I361 Nonrheumatic tricuspid (valve) insufficiency: Secondary | ICD-10-CM | POA: Diagnosis not present

## 2017-08-02 DIAGNOSIS — E1043 Type 1 diabetes mellitus with diabetic autonomic (poly)neuropathy: Secondary | ICD-10-CM | POA: Diagnosis not present

## 2017-08-02 DIAGNOSIS — I951 Orthostatic hypotension: Secondary | ICD-10-CM | POA: Diagnosis not present

## 2017-08-02 DIAGNOSIS — H1033 Unspecified acute conjunctivitis, bilateral: Secondary | ICD-10-CM | POA: Diagnosis not present

## 2017-08-02 LAB — CULTURE, GROUP A STREP (THRC)

## 2017-08-02 MED ORDER — POLYMYXIN B-TRIMETHOPRIM 10000-0.1 UNIT/ML-% OP SOLN: 1.0000 [drp] | OPHTHALMIC | 0 refills | Status: DC

## 2017-08-02 NOTE — ED Triage Notes (Signed)
Drainage from both eyes, feverish, nasal congestion, and headache.  Seen in UC 2 days ago.

## 2017-08-02 NOTE — ED Provider Notes (Signed)
Alejandra Valdez CARE    CSN: [id number] Arrival date & time: 08/02/17  1717     History   Chief Complaint Chief Complaint  Patient presents with  . Eye Drainage    HPI Alejandra Valdez is a 62 y.o. female.   HPI Alejandra Valdez is a 62 y.o. female presenting to UC with c/o bilateral eye irritation and drainage for about 2-3 days.  Symptoms started in Left eye and are now in both eyes.  Crusting and thick discharge is worse in the morning.  She has tried her her Restasis but no relief.  She wears glasses but never contacts.  She was seen at a different UC 2 days ago and started on Augmentin and has been taking as prescribed. Denies fever, chills, n/v/d. No change in vision.    Past Medical History:  Diagnosis Date  . ADHD (attention deficit hyperactivity disorder)   . Anxiety   . Arthritis    Of spine DDD/DJD  . CAD (coronary artery disease)    a. s/p CABG in 2013 with LIMA-LAD, SVG-RCA, and SVG-OM1-D2  . Cancer [facility]) 2012   Breast  . Depression   . Diabetes mellitus without complication ([place])   . H/O bilateral breast implants 01/24/2016  . History of colon polyps   . Hypothyroid   . Lung nodule    Stable per CT 05/2013  . Migraine   . Myocardial infarction ([place]) 2013  . Neuropathy   . Osteoporosis     Patient Active Problem List   Diagnosis Date Noted  . History of ductal carcinoma in situ (DCIS) of breast 05/18/2017  . Bloating symptom 05/11/2017  . Colon polyp 05/11/2017  . Greater trochanteric bursitis of left hip 12/24/2016  . Coronary artery disease involving coronary bypass graft of native heart with angina pectoris ([place]) 11/18/2016  . Chest pain 11/18/2016  . DDD (degenerative disc disease), cervical 10/16/2016  . Presence of insulin pump 07/19/2016  . Dyslipidemia due to type 1 diabetes mellitus ([place]) 07/19/2016  . Primary osteoarthritis of left knee 05/28/2016  . Greater trochanteric bursitis, right 05/28/2016  . H/O bilateral mastectomy 01/24/2016  . H/O  bilateral breast implants 01/24/2016  . Aortic sclerosis 01/02/2016  . Personal history of multiple pulmonary nodules 07/25/2015  . Pericardial effusion 07/25/2015  . CAD (coronary artery disease) 07/24/2015  . Cognitive dysfunction 04/09/2015  . Osteoporosis 04/09/2015  . Hypothyroidism 04/09/2015  . Hyperlipidemia 04/09/2015  . DDD (degenerative disc disease), lumbar 04/09/2015  . Lung nodule 04/09/2015  . History of breast cancer 04/09/2015  . S/P CABG x 4 04/08/2015  . Chronic constipation 04/08/2015  . Fibromyalgia 04/08/2015  . Diabetes type 1, controlled ([place]) 04/08/2015  . Hereditary and idiopathic peripheral neuropathy 04/08/2015  . Persistent mood (affective) disorder, unspecified ([place]) 04/08/2015  . Diabetic retinopathy ([place]) 04/08/2015  . Chronic insomnia 04/08/2015  . DKA (diabetic ketoacidoses) ([place]) 09/25/2013  . Polypharmacy 02/01/2012  . Myalgia 02/01/2012  . Fracture 02/01/2012  . Fatigue 02/01/2012  . Uncontrolled type 1 diabetes mellitus with ophthalmic complication ([place]) 34/19/3790  . Vitamin D deficiency 10/21/2009  . Attention deficit hyperactivity disorder (ADHD) 10/17/2009    Past Surgical History:  Procedure Laterality Date  . ABDOMINAL HYSTERECTOMY     has left ovary  . APPENDECTOMY    . BILATERAL CARPAL TUNNEL RELEASE    . BLEPHAROPLASTY Bilateral   . CATARACT EXTRACTION Bilateral   . CESAREAN SECTION     x2  . CORONARY ARTERY BYPASS GRAFT  2013  x4  . MASTECTOMY Bilateral 2012  . OTHER SURGICAL HISTORY     Retinal repair  . SHOULDER SURGERY Bilateral     OB History    No data available       Home Medications    Prior to Admission medications   Medication Sig Start Date End Date Taking? Authorizing Provider  Acetone, Urine, Test (KETONE TEST) STRP by Does not apply route. 07/20/16   [provider]  amoxicillin-clavulanate (AUGMENTIN) 875-125 MG tablet Take 1 tablet by mouth every 12 (twelve) hours. 07/30/17   Coral Spikes, DO  aspirin EC 81 MG tablet Take 1 tablet (81 mg total) by mouth daily. 11/18/16   Strader, Fransisco Hertz, PA-C  BAYER CONTOUR NEXT TEST test strip 150 each as needed. 03/23/15   [provider]  celecoxib (CELEBREX) 200 MG capsule Take 1 capsule (200 mg total) by mouth daily. *NO ADDITIONAL REFILLS. PLEASE CALL THE OFFICE TO SCHEDULE AN APPOINTMENT* 11/28/15   Hali Marry, MD  cycloSPORINE (RESTASIS) 0.05 % ophthalmic emulsion Place 1 drop into both eyes 2 (two) times daily. 04/17/15   Hali Marry, MD  ezetimibe (ZETIA) 10 MG tablet TAKE 1 TABLET(10 MG) BY MOUTH DAILY. 11/18/16   Strader, Fransisco Hertz, PA-C  ibandronate (BONIVA) 150 MG tablet Take one tablet by mouth every 30 days. 05/13/17   Hali Marry, MD  insulin aspart (NOVOLOG) 100 UNIT/ML injection Inject into the skin as needed for high blood sugar.    [provider]  lamoTRIgine (LAMICTAL) 100 MG tablet Take 1 tablet (100 mg total) daily by mouth. 05/13/17   Merian Capron, MD  LATUDA 20 MG TABS tablet TAKE 1 TABLET(20 MG) BY MOUTH AT BEDTIME 05/13/17   Merian Capron, MD  levothyroxine (SYNTHROID, LEVOTHROID) 137 MCG tablet TAKE 1 TABLET(137 MCG) BY MOUTH DAILY BEFORE BREAKFAST 07/28/16   Hali Marry, MD  Liraglutide (VICTOZA) 18 MG/3ML SOPN Inject 3 mLs into the skin 3 (three) times daily.    [provider]  meloxicam (MOBIC) 15 MG tablet TAKE 1 TABLET BY MOUTH EVERY MORNING WITH BREAKFAST FOR 2 WEEKS AS NEEDED FOR PAIN 02/09/17   Silverio Decamp, MD  metoprolol succinate (TOPROL-XL) 25 MG 24 hr tablet Take 1 tablet (25 mg total) by mouth daily. 11/18/16   Strader, Fransisco Hertz, PA-C  naratriptan (AMERGE) 2.5 MG tablet Take 2.5 mg by mouth. Take one tablet at onset of migraine do not take more than 1 a day 02/08/15   [provider]  nitroGLYCERIN (NITROSTAT) 0.4 MG SL tablet DISSOLVE 1 TABLET EVERY Q 5 MIN AS NEEDED FOR CHEST PAIN 02/10/17   [provider]    nortriptyline (PAMELOR) 10 MG capsule TAKE 1 CAPSULE(10 MG) BY MOUTH THREE TIMES DAILY. FOLLOW UP APPOINTMENT BEFORE MORE REFILLS Patient taking differently: TAKE 1 CAPSULE(10 MG) BY MOUTH at bedtime 08/12/15   Hali Marry, MD  omeprazole (PRILOSEC) 40 MG capsule TAKE 1 CAPSULE BY MOUTH EVERY DAY 06/28/17   Hali Marry, MD  simvastatin (ZOCOR) 40 MG tablet Take 1 tablet (40 mg total) by mouth daily at 6 PM. 11/18/16   Strader, Fransisco Hertz, PA-C  traZODone (DESYREL) 100 MG tablet Take 2 tablets (200 mg total) by mouth at bedtime. 05/18/17   Hali Marry, MD  trimethoprim-polymyxin b (POLYTRIM) ophthalmic solution Place 1 drop into the right eye every 4 (four) hours. For 7-10 days 08/02/17   Noe Gens, PA-C  VIIBRYD 40 MG TABS Take  1 tablet (40 mg total) daily by mouth. 05/13/17   Merian Capron, MD  clonazePAM (KLONOPIN) 1 MG tablet Take 2 mg by mouth daily as needed.  03/14/15 05/13/17  [provider]  temazepam (RESTORIL) 15 MG capsule TK ONE C PO QHS 08/12/15 05/13/17  [provider]    Family History Family History  Problem Relation Age of Onset  . Depression Mother   . Alcohol abuse Father   . Hyperlipidemia Father   . Hypertension Father   . Stroke Father   . Alcohol abuse Brother   . Depression Brother   . Hyperlipidemia Brother   . Hypertension Brother   . Cancer Maternal Grandmother   . Heart attack Maternal Grandmother   . Diabetes Maternal Grandfather   . Alcohol abuse Brother   . Depression Brother   . Hyperlipidemia Brother   . Hypertension Brother     Social History Social History   Tobacco Use  . Smoking status: Never Smoker  . Smokeless tobacco: Never Used  Substance Use Topics  . Alcohol use: No    Alcohol/week: 0.0 oz  . Drug use: No     Allergies   Vicodin [hydrocodone-acetaminophen]   Review of Systems Review of Systems  Constitutional: Negative for chills and fever.  HENT: Positive for congestion  and rhinorrhea. Negative for ear pain and sore throat.   Eyes: Positive for discharge, redness and itching. Negative for photophobia, pain and visual disturbance.  Neurological: Negative for dizziness, light-headedness and headaches.     Physical Exam Triage Vital Signs ED Triage Vitals  Enc Vitals Group     BP 08/02/17 1807 (!) 141/86     Pulse Rate 08/02/17 1807 (!) 107     Resp --      Temp 08/02/17 1807 98.3 F (36.8 C)     Temp Source 08/02/17 1807 Oral     SpO2 08/02/17 1807 95 %     Weight 08/02/17 1808 156 lb (70.8 kg)     Height 08/02/17 1808 5\' 7"  (1.702 m)     Head Circumference --      Peak Flow --      Pain Score 08/02/17 1808 0     Pain Loc --      Pain Edu? --      Excl. in [place]? --    No data found.  Updated Vital Signs BP (!) 141/86 (BP Location: Right Arm)   Pulse (!) 107   Temp 98.3 F (36.8 C) (Oral)   Ht 5\' 7"  (1.702 m)   Wt 156 lb (70.8 kg)   SpO2 95%   BMI 24.43 kg/m   Visual Acuity Right Eye Distance:   Left Eye Distance:   Bilateral Distance:    Right Eye Near:   Left Eye Near:    Bilateral Near:     Physical Exam  Constitutional: She is oriented to person, place, and time. She appears well-developed and well-nourished. No distress.  HENT:  Head: Normocephalic and atraumatic.  Mouth/Throat: Oropharynx is clear and moist.  Eyes: EOM are normal. Pupils are equal, round, and reactive to light. Right eye exhibits discharge. Left eye exhibits discharge. Right conjunctiva is injected. Left conjunctiva is injected. No scleral icterus.  Bilateral: minimally bilateral eyes injected. Scant yellow crusting discharge.   Neck: Normal range of motion.  Cardiovascular: Normal rate.  Pulmonary/Chest: Effort normal.  Musculoskeletal: Normal range of motion.  Neurological: She is alert and oriented to person, place, and time.  Skin: Skin  is warm and dry. She is not diaphoretic.  Psychiatric: She has a normal mood and affect. Her behavior is normal.    Nursing note and vitals reviewed.    UC Treatments / Results  Labs (all labs ordered are listed, but only abnormal results are displayed) Labs Reviewed - No data to display  EKG  EKG Interpretation None       Radiology No results found.  Procedures Procedures (including critical care time)  Medications Ordered in UC Medications - No data to display   Initial Impression / Assessment and Plan / UC Course  I have reviewed the triage vital signs and the nursing notes.  Pertinent labs & imaging results that were available during my care of the patient were reviewed by me and considered in my medical decision making (see chart for details).     Will tx with Polytrim Encouraged to not use eye makeup while symptoms are present F/u with PCP in 1 week if not improving, sooner if significantly worsening.  Continue taking Augmentin as previously prescribed.   Final Clinical Impressions(s) / UC Diagnoses   Final diagnoses:  Acute conjunctivitis of both eyes, unspecified acute conjunctivitis type    ED Discharge Orders        Ordered    trimethoprim-polymyxin b (POLYTRIM) ophthalmic solution  Every 4 hours     08/02/17 1841       Controlled Substance Prescriptions  Controlled Substance Registry consulted? Not Applicable   Tyrell Antonio 08/02/17 Valerie Roys

## 2017-08-05 DIAGNOSIS — G43719 Chronic migraine without aura, intractable, without status migrainosus: Secondary | ICD-10-CM | POA: Diagnosis not present

## 2017-08-05 DIAGNOSIS — R4182 Altered mental status, unspecified: Secondary | ICD-10-CM | POA: Diagnosis not present

## 2017-08-05 DIAGNOSIS — E1042 Type 1 diabetes mellitus with diabetic polyneuropathy: Secondary | ICD-10-CM | POA: Diagnosis not present

## 2017-08-05 DIAGNOSIS — G3184 Mild cognitive impairment, so stated: Secondary | ICD-10-CM | POA: Diagnosis not present

## 2017-08-19 DIAGNOSIS — E103513 Type 1 diabetes mellitus with proliferative diabetic retinopathy with macular edema, bilateral: Secondary | ICD-10-CM | POA: Diagnosis not present

## 2017-08-19 DIAGNOSIS — E109 Type 1 diabetes mellitus without complications: Secondary | ICD-10-CM | POA: Diagnosis not present

## 2017-08-19 DIAGNOSIS — E1043 Type 1 diabetes mellitus with diabetic autonomic (poly)neuropathy: Secondary | ICD-10-CM | POA: Diagnosis not present

## 2017-08-19 DIAGNOSIS — E1065 Type 1 diabetes mellitus with hyperglycemia: Secondary | ICD-10-CM | POA: Diagnosis not present

## 2017-08-19 DIAGNOSIS — E10649 Type 1 diabetes mellitus with hypoglycemia without coma: Secondary | ICD-10-CM | POA: Diagnosis not present

## 2017-08-27 ENCOUNTER — Telehealth: Payer: Self-pay

## 2017-08-27 ENCOUNTER — Other Ambulatory Visit: Payer: Self-pay | Admitting: *Deleted

## 2017-08-27 DIAGNOSIS — E1059 Type 1 diabetes mellitus with other circulatory complications: Secondary | ICD-10-CM

## 2017-08-27 NOTE — Telephone Encounter (Signed)
Patient called in requesting the following:  1- 4 pills of Viibryd and Pristiq to get her through to Tuesday for her new psychiatry appt with Marvis Moeller and Eligah East  2- Order for Bone Density Scan  3- Referral for new Endocrinology - patient states she does not want to be referred to Dr. Nicoletta Dress or Dr. Maia Petties. She advises her availability is 3/11,3/12/, 3/18,3/19 Mon/Tues afternoons.  Patient advised the following:  1- PCP does not Rx for psychiatry medications - patient will need to contact Dr. De Nurse for the pills.  2- Advised Bone Density Scan has been ordered since 12/24/16 - good for one year. Gave number to contact [facility] [phone] to schedule her appt.  3- Referral for new Endocrinology  - has been sent to referral specialist.   Patient stated she understood - no further questions.

## 2017-08-30 DIAGNOSIS — R413 Other amnesia: Secondary | ICD-10-CM | POA: Diagnosis not present

## 2017-08-30 DIAGNOSIS — Z79899 Other long term (current) drug therapy: Secondary | ICD-10-CM | POA: Diagnosis not present

## 2017-08-30 DIAGNOSIS — R410 Disorientation, unspecified: Secondary | ICD-10-CM | POA: Diagnosis not present

## 2017-08-31 DIAGNOSIS — F3340 Major depressive disorder, recurrent, in remission, unspecified: Secondary | ICD-10-CM | POA: Diagnosis not present

## 2017-09-01 DIAGNOSIS — L814 Other melanin hyperpigmentation: Secondary | ICD-10-CM | POA: Diagnosis not present

## 2017-09-01 DIAGNOSIS — L821 Other seborrheic keratosis: Secondary | ICD-10-CM | POA: Diagnosis not present

## 2017-09-01 DIAGNOSIS — D225 Melanocytic nevi of trunk: Secondary | ICD-10-CM | POA: Diagnosis not present

## 2017-09-01 DIAGNOSIS — S90822A Blister (nonthermal), left foot, initial encounter: Secondary | ICD-10-CM | POA: Diagnosis not present

## 2017-09-07 ENCOUNTER — Ambulatory Visit: Payer: Self-pay

## 2017-09-07 ENCOUNTER — Other Ambulatory Visit (HOSPITAL_COMMUNITY): Payer: Self-pay | Admitting: Psychiatry

## 2017-09-09 ENCOUNTER — Other Ambulatory Visit: Payer: Self-pay

## 2017-09-09 MED ORDER — INSULIN ASPART 100 UNIT/ML ~~LOC~~ SOLN: SUBCUTANEOUS | 3 refills | Status: AC

## 2017-09-09 NOTE — Telephone Encounter (Signed)
Medications sent.  Okay to place referral if needed to Dr. Leroy Sea.

## 2017-09-09 NOTE — Telephone Encounter (Signed)
Dr Madilyn Fireman,  [facility] has not got in with an endocrinologist and needs a refill on insulin. Please advise. Confirmed pharmacy and dosing.     Jenny Reichmann,   [place] would rather go to Dr Malachy Mood instead of Dr Steffanie Dunn.    Dr. Lyna Poser. Mare Ferrari, MD Endocrinologist in [place], [place]  Address: [address] [place] [address] Phone: [phone]

## 2017-09-10 NOTE — Telephone Encounter (Signed)
Referral has been sent.

## 2017-09-12 ENCOUNTER — Other Ambulatory Visit: Payer: Self-pay | Admitting: Family Medicine

## 2017-09-14 ENCOUNTER — Ambulatory Visit (INDEPENDENT_AMBULATORY_CARE_PROVIDER_SITE_OTHER): Payer: BLUE CROSS/BLUE SHIELD

## 2017-09-14 DIAGNOSIS — M85851 Other specified disorders of bone density and structure, right thigh: Secondary | ICD-10-CM | POA: Diagnosis not present

## 2017-09-14 DIAGNOSIS — M8588 Other specified disorders of bone density and structure, other site: Secondary | ICD-10-CM | POA: Diagnosis not present

## 2017-09-14 DIAGNOSIS — M8589 Other specified disorders of bone density and structure, multiple sites: Secondary | ICD-10-CM | POA: Diagnosis not present

## 2017-09-14 DIAGNOSIS — M810 Age-related osteoporosis without current pathological fracture: Secondary | ICD-10-CM

## 2017-09-14 DIAGNOSIS — Z780 Asymptomatic menopausal state: Secondary | ICD-10-CM | POA: Diagnosis not present

## 2017-09-16 ENCOUNTER — Ambulatory Visit (INDEPENDENT_AMBULATORY_CARE_PROVIDER_SITE_OTHER): Payer: BLUE CROSS/BLUE SHIELD | Admitting: Sports Medicine

## 2017-09-16 ENCOUNTER — Telehealth: Payer: Self-pay | Admitting: Sports Medicine

## 2017-09-16 ENCOUNTER — Encounter: Payer: Self-pay | Admitting: Sports Medicine

## 2017-09-16 DIAGNOSIS — M797 Fibromyalgia: Secondary | ICD-10-CM

## 2017-09-16 MED ORDER — VILAZODONE HCL 10 MG PO TABS: ORAL_TABLET | ORAL | 0 refills | Status: DC

## 2017-09-16 MED ORDER — VILAZODONE HCL 20 MG PO TABS: ORAL_TABLET | ORAL | 0 refills | Status: DC

## 2017-09-16 MED ORDER — PREGABALIN 50 MG PO CAPS: 50.0000 mg | ORAL_CAPSULE | Freq: Two times a day (BID) | ORAL | 0 refills | Status: DC | PRN

## 2017-09-16 MED ORDER — DULOXETINE HCL 30 MG PO CPEP
30.0000 mg | ORAL_CAPSULE | Freq: Every day | ORAL | 3 refills | Status: DC
Start: 2017-09-16 — End: 2018-01-03

## 2017-09-16 NOTE — Telephone Encounter (Signed)
Approvedtoday 09/16/2017. Effective from 09/16/2017 through 09/14/2020. Spoke with Lennette Bihari at [facility] in [place] and he is aware Lyrica is approved.

## 2017-09-16 NOTE — Assessment & Plan Note (Signed)
Currently in a fibro-flare, pain all over with hyperalgesia and allodynia. I am going to transition her from [place] to Cymbalta. We will do a 2-week down taper on Viibryd and start low-dose Cymbalta. Also adding low-dose Lyrica, has not responded to gabapentin in the past. Return to see me in 1 month, I did describe the pathophysiology of fibromyalgia and she is aware of the duration of time needed to achieve a correct dose.

## 2017-09-16 NOTE — Progress Notes (Signed)
Subjective:    I'm seeing this patient as a consultation for: Dr. Beatrice Lecher  CC: Widespread pain  HPI: This is a pleasant 62 year old female, she has known cervical and lumbar degenerative disc disease, and osteoarthritis but more recently she has had a flare of pain all over her body, but predominantly over the neck, shoulder girdle, hip girdle, back.  She tells me her pain is out of proportion, and that light palpation and light touch causes her extreme discomfort.  Symptoms are severe, persistent.  I reviewed the past medical history, family history, social history, surgical history, and allergies today and no changes were needed.  Please see the problem list section below in epic for further details.  Past Medical History: Past Medical History:  Diagnosis Date  . ADHD (attention deficit hyperactivity disorder)   . Anxiety   . Arthritis    Of spine DDD/DJD  . CAD (coronary artery disease)    a. s/p CABG in 2013 with LIMA-LAD, SVG-RCA, and SVG-OM1-D2  . [facility]   Breast  . Depression   . Diabetes mellitus without complication ([place])   . H/O bilateral breast implants 01/24/2016  . History of colon polyps   . Hypothyroid   . Lung nodule    Stable per CT 05/2013  . Migraine   . Myocardial infarction ([place]) 2013  . Neuropathy   . Osteoporosis    Past Surgical History: Past Surgical History:  Procedure Laterality Date  . ABDOMINAL HYSTERECTOMY     has left ovary  . APPENDECTOMY    . BILATERAL CARPAL TUNNEL RELEASE    . BLEPHAROPLASTY Bilateral   . CATARACT EXTRACTION Bilateral   . CESAREAN SECTION     x2  . CORONARY ARTERY BYPASS GRAFT  2013   x4  . MASTECTOMY Bilateral 2012  . OTHER SURGICAL HISTORY     Retinal repair  . SHOULDER SURGERY Bilateral    Social History: Social History   Socioeconomic History  . Marital status: Married    Spouse name: bobby   . Number of children: 1  . Years of education: Not on file  . Highest education level:  Not on file  Occupational History  . Occupation: Disabled  Social Needs  . Financial resource strain: Not on file  . Food insecurity:    Worry: Not on file    Inability: Not on file  . Transportation needs:    Medical: Not on file    Non-medical: Not on file  Tobacco Use  . Smoking status: Never Smoker  . Smokeless tobacco: Never Used  Substance and Sexual Activity  . Alcohol use: No    Alcohol/week: 0.0 oz  . Drug use: No  . Sexual activity: Never    Partners: Male  Lifestyle  . Physical activity:    Days per week: Not on file    Minutes per session: Not on file  . Stress: Not on file  Relationships  . Social connections:    Talks on phone: Not on file    Gets together: Not on file    Attends religious service: Not on file    Active member of club or organization: Not on file    Attends meetings of clubs or organizations: Not on file    Relationship status: Not on file  Other Topics Concern  . Not on file  Social History Narrative   No daily caffeine intake. Walks for 30 minutes for exercise. She is currently disabled.   Family  History: Family History  Problem Relation Age of Onset  . Depression Mother   . Alcohol abuse Father   . Hyperlipidemia Father   . Hypertension Father   . Stroke Father   . Alcohol abuse Brother   . Depression Brother   . Hyperlipidemia Brother   . Hypertension Brother   . Cancer Maternal Grandmother   . Heart attack Maternal Grandmother   . Diabetes Maternal Grandfather   . Alcohol abuse Brother   . Depression Brother   . Hyperlipidemia Brother   . Hypertension Brother    Allergies: Allergies  Allergen Reactions  . Vicodin [Hydrocodone-Acetaminophen]    Medications: See med rec.  Review of Systems: No headache, visual changes, nausea, vomiting, diarrhea, constipation, dizziness, abdominal pain, skin rash, fevers, chills, night sweats, weight loss, swollen lymph nodes, body aches, joint swelling, muscle aches, chest pain,  shortness of breath, mood changes, visual or auditory hallucinations.   Objective:   General: Well Developed, well nourished, and in no acute distress.  Neuro:  Extra-ocular muscles intact, able to move all 4 extremities, sensation grossly intact.  Deep tendon reflexes tested were normal. Psych: Alert and oriented, mood congruent with affect. ENT:  Ears and nose appear unremarkable.  Hearing grossly normal. Neck: Unremarkable overall appearance, trachea midline.  No visible thyroid enlargement. Eyes: Conjunctivae and lids appear unremarkable.  Pupils equal and round. Skin: Warm and dry, no rashes noted.  Cardiovascular: Pulses palpable, no extremity edema. Neck: Negative spurling's Full neck range of motion Grip strength and sensation normal in bilateral hands Strength good C4 to T1 distribution No sensory change to C4 to T1 Reflexes normal Back Exam:  Inspection: Unremarkable  Motion: Flexion 45 deg, Extension 45 deg, Side Bending to 45 deg bilaterally,  Rotation to 45 deg bilaterally  SLR laying: Negative  XSLR laying: Negative  Palpable tenderness: Pain across the cervical, thoracic, and lumbar spine as well as hip and shoulder girdle is out of proportion to the degree of palpation. FABER: negative. Sensory change: Gross sensation intact to all lumbar and sacral dermatomes.  Reflexes: 2+ at both patellar tendons, 2+ at achilles tendons, Babinski's downgoing.  Strength at foot  Plantar-flexion: 5/5 Dorsi-flexion: 5/5 Eversion: 5/5 Inversion: 5/5  Leg strength  Quad: 5/5 Hamstring: 5/5 Hip flexor: 5/5 Hip abductors: 5/5  Gait unremarkable.  Impression and Recommendations:   This case required medical decision making of moderate complexity.  Fibromyalgia Currently in a fibro-flare, pain all over with hyperalgesia and allodynia. I am going to transition her from [place] to Cymbalta. We will do a 2-week down taper on Viibryd and start low-dose Cymbalta. Also adding low-dose  Lyrica, has not responded to gabapentin in the past. Return to see me in 1 month, I did describe the pathophysiology of fibromyalgia and she is aware of the duration of time needed to achieve a correct dose. ___________________________________________ Gwen Her. Dianah Field, M.D., ABFM., CAQSM. Primary Care and [place] Instructor of [place] [employer] [facility] [employer]

## 2017-09-17 DIAGNOSIS — I340 Nonrheumatic mitral (valve) insufficiency: Secondary | ICD-10-CM | POA: Diagnosis not present

## 2017-09-17 DIAGNOSIS — G2581 Restless legs syndrome: Secondary | ICD-10-CM | POA: Diagnosis not present

## 2017-09-17 DIAGNOSIS — K5900 Constipation, unspecified: Secondary | ICD-10-CM | POA: Diagnosis not present

## 2017-09-17 DIAGNOSIS — Z853 Personal history of malignant neoplasm of breast: Secondary | ICD-10-CM | POA: Diagnosis not present

## 2017-09-17 DIAGNOSIS — R197 Diarrhea, unspecified: Secondary | ICD-10-CM | POA: Diagnosis not present

## 2017-09-17 DIAGNOSIS — Z794 Long term (current) use of insulin: Secondary | ICD-10-CM | POA: Diagnosis not present

## 2017-09-17 DIAGNOSIS — R55 Syncope and collapse: Secondary | ICD-10-CM | POA: Diagnosis not present

## 2017-09-17 DIAGNOSIS — I10 Essential (primary) hypertension: Secondary | ICD-10-CM | POA: Diagnosis not present

## 2017-09-17 DIAGNOSIS — I358 Other nonrheumatic aortic valve disorders: Secondary | ICD-10-CM | POA: Diagnosis not present

## 2017-09-17 DIAGNOSIS — E119 Type 2 diabetes mellitus without complications: Secondary | ICD-10-CM | POA: Diagnosis not present

## 2017-09-17 DIAGNOSIS — R42 Dizziness and giddiness: Secondary | ICD-10-CM | POA: Diagnosis not present

## 2017-09-17 DIAGNOSIS — R1110 Vomiting, unspecified: Secondary | ICD-10-CM | POA: Diagnosis not present

## 2017-09-17 DIAGNOSIS — I2510 Atherosclerotic heart disease of native coronary artery without angina pectoris: Secondary | ICD-10-CM | POA: Diagnosis not present

## 2017-09-17 DIAGNOSIS — Z90710 Acquired absence of both cervix and uterus: Secondary | ICD-10-CM | POA: Diagnosis not present

## 2017-09-17 DIAGNOSIS — F329 Major depressive disorder, single episode, unspecified: Secondary | ICD-10-CM | POA: Diagnosis not present

## 2017-09-17 DIAGNOSIS — Z7982 Long term (current) use of aspirin: Secondary | ICD-10-CM | POA: Diagnosis not present

## 2017-09-17 DIAGNOSIS — Z951 Presence of aortocoronary bypass graft: Secondary | ICD-10-CM | POA: Diagnosis not present

## 2017-09-17 DIAGNOSIS — Z79899 Other long term (current) drug therapy: Secondary | ICD-10-CM | POA: Diagnosis not present

## 2017-09-28 DIAGNOSIS — H0288B Meibomian gland dysfunction left eye, upper and lower eyelids: Secondary | ICD-10-CM | POA: Diagnosis not present

## 2017-09-28 DIAGNOSIS — Z961 Presence of intraocular lens: Secondary | ICD-10-CM | POA: Diagnosis not present

## 2017-09-28 DIAGNOSIS — E113591 Type 2 diabetes mellitus with proliferative diabetic retinopathy without macular edema, right eye: Secondary | ICD-10-CM | POA: Diagnosis not present

## 2017-09-28 DIAGNOSIS — H04123 Dry eye syndrome of bilateral lacrimal glands: Secondary | ICD-10-CM | POA: Diagnosis not present

## 2017-09-28 DIAGNOSIS — H0288A Meibomian gland dysfunction right eye, upper and lower eyelids: Secondary | ICD-10-CM | POA: Diagnosis not present

## 2017-10-04 DIAGNOSIS — I2510 Atherosclerotic heart disease of native coronary artery without angina pectoris: Secondary | ICD-10-CM | POA: Diagnosis not present

## 2017-10-04 DIAGNOSIS — I951 Orthostatic hypotension: Secondary | ICD-10-CM | POA: Diagnosis not present

## 2017-10-04 DIAGNOSIS — E1043 Type 1 diabetes mellitus with diabetic autonomic (poly)neuropathy: Secondary | ICD-10-CM | POA: Diagnosis not present

## 2017-10-04 DIAGNOSIS — I361 Nonrheumatic tricuspid (valve) insufficiency: Secondary | ICD-10-CM | POA: Diagnosis not present

## 2017-10-05 DIAGNOSIS — F3340 Major depressive disorder, recurrent, in remission, unspecified: Secondary | ICD-10-CM | POA: Diagnosis not present

## 2017-10-13 DIAGNOSIS — M810 Age-related osteoporosis without current pathological fracture: Secondary | ICD-10-CM | POA: Diagnosis not present

## 2017-10-13 DIAGNOSIS — Z794 Long term (current) use of insulin: Secondary | ICD-10-CM | POA: Diagnosis not present

## 2017-10-13 DIAGNOSIS — S46812A Strain of other muscles, fascia and tendons at shoulder and upper arm level, left arm, initial encounter: Secondary | ICD-10-CM | POA: Diagnosis not present

## 2017-10-13 DIAGNOSIS — E039 Hypothyroidism, unspecified: Secondary | ICD-10-CM | POA: Diagnosis not present

## 2017-10-13 DIAGNOSIS — Z7982 Long term (current) use of aspirin: Secondary | ICD-10-CM | POA: Diagnosis not present

## 2017-10-13 DIAGNOSIS — Z8673 Personal history of transient ischemic attack (TIA), and cerebral infarction without residual deficits: Secondary | ICD-10-CM | POA: Diagnosis not present

## 2017-10-13 DIAGNOSIS — M1990 Unspecified osteoarthritis, unspecified site: Secondary | ICD-10-CM | POA: Diagnosis not present

## 2017-10-13 DIAGNOSIS — I10 Essential (primary) hypertension: Secondary | ICD-10-CM | POA: Diagnosis not present

## 2017-10-13 DIAGNOSIS — S42035A Nondisplaced fracture of lateral end of left clavicle, initial encounter for closed fracture: Secondary | ICD-10-CM | POA: Diagnosis not present

## 2017-10-13 DIAGNOSIS — Z9641 Presence of insulin pump (external) (internal): Secondary | ICD-10-CM | POA: Diagnosis not present

## 2017-10-13 DIAGNOSIS — Y93K1 Activity, walking an animal: Secondary | ICD-10-CM | POA: Diagnosis not present

## 2017-10-13 DIAGNOSIS — E785 Hyperlipidemia, unspecified: Secondary | ICD-10-CM | POA: Diagnosis not present

## 2017-10-13 DIAGNOSIS — Z853 Personal history of malignant neoplasm of breast: Secondary | ICD-10-CM | POA: Diagnosis not present

## 2017-10-13 DIAGNOSIS — F329 Major depressive disorder, single episode, unspecified: Secondary | ICD-10-CM | POA: Diagnosis not present

## 2017-10-13 DIAGNOSIS — Z79899 Other long term (current) drug therapy: Secondary | ICD-10-CM | POA: Diagnosis not present

## 2017-10-13 DIAGNOSIS — M25512 Pain in left shoulder: Secondary | ICD-10-CM | POA: Diagnosis not present

## 2017-10-13 DIAGNOSIS — W01198A Fall on same level from slipping, tripping and stumbling with subsequent striking against other object, initial encounter: Secondary | ICD-10-CM | POA: Diagnosis not present

## 2017-10-13 DIAGNOSIS — E103599 Type 1 diabetes mellitus with proliferative diabetic retinopathy without macular edema, unspecified eye: Secondary | ICD-10-CM | POA: Diagnosis not present

## 2017-10-13 DIAGNOSIS — S46912A Strain of unspecified muscle, fascia and tendon at shoulder and upper arm level, left arm, initial encounter: Secondary | ICD-10-CM | POA: Diagnosis not present

## 2017-10-13 DIAGNOSIS — S40012A Contusion of left shoulder, initial encounter: Secondary | ICD-10-CM | POA: Diagnosis not present

## 2017-10-18 DIAGNOSIS — E10649 Type 1 diabetes mellitus with hypoglycemia without coma: Secondary | ICD-10-CM | POA: Diagnosis not present

## 2017-10-18 DIAGNOSIS — E1043 Type 1 diabetes mellitus with diabetic autonomic (poly)neuropathy: Secondary | ICD-10-CM | POA: Diagnosis not present

## 2017-10-18 DIAGNOSIS — E1069 Type 1 diabetes mellitus with other specified complication: Secondary | ICD-10-CM | POA: Diagnosis not present

## 2017-10-18 DIAGNOSIS — E103513 Type 1 diabetes mellitus with proliferative diabetic retinopathy with macular edema, bilateral: Secondary | ICD-10-CM | POA: Diagnosis not present

## 2017-10-18 DIAGNOSIS — E039 Hypothyroidism, unspecified: Secondary | ICD-10-CM | POA: Diagnosis not present

## 2017-10-18 DIAGNOSIS — E1065 Type 1 diabetes mellitus with hyperglycemia: Secondary | ICD-10-CM | POA: Diagnosis not present

## 2017-10-29 ENCOUNTER — Other Ambulatory Visit: Payer: Self-pay | Admitting: Sports Medicine

## 2017-10-29 DIAGNOSIS — M797 Fibromyalgia: Secondary | ICD-10-CM

## 2017-10-29 NOTE — Telephone Encounter (Signed)
[facility] requesting refill on Lyrica, 1 capsule BID.  Last RX written 09-16-17 for #60 no RF.  Rx pended, please send if appropriate

## 2017-11-01 DIAGNOSIS — F3340 Major depressive disorder, recurrent, in remission, unspecified: Secondary | ICD-10-CM | POA: Diagnosis not present

## 2017-11-15 ENCOUNTER — Ambulatory Visit: Payer: Self-pay | Admitting: Family Medicine

## 2017-11-17 DIAGNOSIS — E103513 Type 1 diabetes mellitus with proliferative diabetic retinopathy with macular edema, bilateral: Secondary | ICD-10-CM | POA: Diagnosis not present

## 2017-11-17 DIAGNOSIS — E10649 Type 1 diabetes mellitus with hypoglycemia without coma: Secondary | ICD-10-CM | POA: Diagnosis not present

## 2017-11-17 DIAGNOSIS — E1065 Type 1 diabetes mellitus with hyperglycemia: Secondary | ICD-10-CM | POA: Diagnosis not present

## 2017-11-17 DIAGNOSIS — E109 Type 1 diabetes mellitus without complications: Secondary | ICD-10-CM | POA: Diagnosis not present

## 2017-11-17 DIAGNOSIS — E1043 Type 1 diabetes mellitus with diabetic autonomic (poly)neuropathy: Secondary | ICD-10-CM | POA: Diagnosis not present

## 2017-11-24 ENCOUNTER — Other Ambulatory Visit: Payer: Self-pay | Admitting: Student

## 2017-12-14 DIAGNOSIS — E039 Hypothyroidism, unspecified: Secondary | ICD-10-CM | POA: Diagnosis not present

## 2017-12-14 DIAGNOSIS — R0989 Other specified symptoms and signs involving the circulatory and respiratory systems: Secondary | ICD-10-CM | POA: Diagnosis not present

## 2017-12-14 DIAGNOSIS — M2041 Other hammer toe(s) (acquired), right foot: Secondary | ICD-10-CM | POA: Diagnosis not present

## 2017-12-14 DIAGNOSIS — L603 Nail dystrophy: Secondary | ICD-10-CM | POA: Diagnosis not present

## 2017-12-14 DIAGNOSIS — I10 Essential (primary) hypertension: Secondary | ICD-10-CM | POA: Diagnosis not present

## 2017-12-14 DIAGNOSIS — E1065 Type 1 diabetes mellitus with hyperglycemia: Secondary | ICD-10-CM | POA: Diagnosis not present

## 2017-12-15 DIAGNOSIS — E1042 Type 1 diabetes mellitus with diabetic polyneuropathy: Secondary | ICD-10-CM | POA: Diagnosis not present

## 2017-12-15 DIAGNOSIS — R4182 Altered mental status, unspecified: Secondary | ICD-10-CM | POA: Diagnosis not present

## 2017-12-15 DIAGNOSIS — F3340 Major depressive disorder, recurrent, in remission, unspecified: Secondary | ICD-10-CM | POA: Diagnosis not present

## 2017-12-15 DIAGNOSIS — G3184 Mild cognitive impairment, so stated: Secondary | ICD-10-CM | POA: Diagnosis not present

## 2017-12-15 DIAGNOSIS — G43719 Chronic migraine without aura, intractable, without status migrainosus: Secondary | ICD-10-CM | POA: Diagnosis not present

## 2018-01-03 ENCOUNTER — Other Ambulatory Visit: Payer: Self-pay | Admitting: Sports Medicine

## 2018-01-03 DIAGNOSIS — M797 Fibromyalgia: Secondary | ICD-10-CM

## 2018-01-03 NOTE — Telephone Encounter (Signed)
To PCP

## 2018-01-03 NOTE — Telephone Encounter (Signed)
Dr. Darene Lamer.,  As per your note on 09/16/17:  Impression and Recommendations:   This case required medical decision making of moderate complexity.  Fibromyalgia Currently in a fibro-flare, pain all over with hyperalgesia and allodynia. I am going to transition her from [place] to Cymbalta. We will do a 2-week down taper on Viibryd and start low-dose Cymbalta. Also adding low-dose Lyrica, has not responded to gabapentin in the past. Return to see me in 1 month, I did describe the pathophysiology of fibromyalgia and she is aware of the duration of time needed to achieve a correct dose. ___________________________________________ Gwen Her. Dianah Field, M.D., ABFM., CAQSM. Primary Care and Sports Medicine [facility]    Dr. Madilyn Fireman has not filled this medication for her since 07/15/2015.  You are now the prescribing physician for this medication.   Will fwd back to Dr. Dianah Field for review and signature, THANKS!!!.Melvena Vink, [place] Crocker, CMA

## 2018-01-31 DIAGNOSIS — R0989 Other specified symptoms and signs involving the circulatory and respiratory systems: Secondary | ICD-10-CM | POA: Diagnosis not present

## 2018-01-31 DIAGNOSIS — F3340 Major depressive disorder, recurrent, in remission, unspecified: Secondary | ICD-10-CM | POA: Diagnosis not present

## 2018-01-31 DIAGNOSIS — E1065 Type 1 diabetes mellitus with hyperglycemia: Secondary | ICD-10-CM | POA: Diagnosis not present

## 2018-02-01 DIAGNOSIS — I10 Essential (primary) hypertension: Secondary | ICD-10-CM | POA: Diagnosis not present

## 2018-02-01 DIAGNOSIS — E103513 Type 1 diabetes mellitus with proliferative diabetic retinopathy with macular edema, bilateral: Secondary | ICD-10-CM | POA: Diagnosis not present

## 2018-02-01 DIAGNOSIS — E039 Hypothyroidism, unspecified: Secondary | ICD-10-CM | POA: Diagnosis not present

## 2018-02-01 DIAGNOSIS — E1065 Type 1 diabetes mellitus with hyperglycemia: Secondary | ICD-10-CM | POA: Diagnosis not present

## 2018-02-17 DIAGNOSIS — E10649 Type 1 diabetes mellitus with hypoglycemia without coma: Secondary | ICD-10-CM | POA: Diagnosis not present

## 2018-02-17 DIAGNOSIS — E103513 Type 1 diabetes mellitus with proliferative diabetic retinopathy with macular edema, bilateral: Secondary | ICD-10-CM | POA: Diagnosis not present

## 2018-02-17 DIAGNOSIS — E1043 Type 1 diabetes mellitus with diabetic autonomic (poly)neuropathy: Secondary | ICD-10-CM | POA: Diagnosis not present

## 2018-02-17 DIAGNOSIS — E109 Type 1 diabetes mellitus without complications: Secondary | ICD-10-CM | POA: Diagnosis not present

## 2018-02-17 DIAGNOSIS — E1065 Type 1 diabetes mellitus with hyperglycemia: Secondary | ICD-10-CM | POA: Diagnosis not present

## 2018-02-19 ENCOUNTER — Other Ambulatory Visit: Payer: Self-pay | Admitting: Family Medicine

## 2018-02-19 ENCOUNTER — Other Ambulatory Visit: Payer: Self-pay | Admitting: Sports Medicine

## 2018-02-19 DIAGNOSIS — M797 Fibromyalgia: Secondary | ICD-10-CM

## 2018-02-21 DIAGNOSIS — E109 Type 1 diabetes mellitus without complications: Secondary | ICD-10-CM | POA: Diagnosis not present

## 2018-03-15 DIAGNOSIS — F3340 Major depressive disorder, recurrent, in remission, unspecified: Secondary | ICD-10-CM | POA: Diagnosis not present

## 2018-03-17 DIAGNOSIS — E1042 Type 1 diabetes mellitus with diabetic polyneuropathy: Secondary | ICD-10-CM | POA: Diagnosis not present

## 2018-03-17 DIAGNOSIS — G43719 Chronic migraine without aura, intractable, without status migrainosus: Secondary | ICD-10-CM | POA: Diagnosis not present

## 2018-03-17 DIAGNOSIS — R4182 Altered mental status, unspecified: Secondary | ICD-10-CM | POA: Diagnosis not present

## 2018-03-17 DIAGNOSIS — G3184 Mild cognitive impairment, so stated: Secondary | ICD-10-CM | POA: Diagnosis not present

## 2018-03-22 ENCOUNTER — Other Ambulatory Visit: Payer: Self-pay | Admitting: Sports Medicine

## 2018-03-22 DIAGNOSIS — M797 Fibromyalgia: Secondary | ICD-10-CM

## 2018-03-22 NOTE — Telephone Encounter (Signed)
To PCP

## 2018-03-23 ENCOUNTER — Other Ambulatory Visit: Payer: Self-pay | Admitting: Sports Medicine

## 2018-03-23 DIAGNOSIS — M797 Fibromyalgia: Secondary | ICD-10-CM

## 2018-04-04 ENCOUNTER — Encounter: Payer: Self-pay | Admitting: Family Medicine

## 2018-04-11 DIAGNOSIS — I2510 Atherosclerotic heart disease of native coronary artery without angina pectoris: Secondary | ICD-10-CM | POA: Diagnosis not present

## 2018-04-11 DIAGNOSIS — I951 Orthostatic hypotension: Secondary | ICD-10-CM | POA: Diagnosis not present

## 2018-04-11 DIAGNOSIS — I361 Nonrheumatic tricuspid (valve) insufficiency: Secondary | ICD-10-CM | POA: Diagnosis not present

## 2018-04-11 DIAGNOSIS — E785 Hyperlipidemia, unspecified: Secondary | ICD-10-CM | POA: Diagnosis not present

## 2018-04-11 DIAGNOSIS — F3340 Major depressive disorder, recurrent, in remission, unspecified: Secondary | ICD-10-CM | POA: Diagnosis not present

## 2018-04-12 DIAGNOSIS — I2510 Atherosclerotic heart disease of native coronary artery without angina pectoris: Secondary | ICD-10-CM | POA: Diagnosis not present

## 2018-04-12 DIAGNOSIS — E1043 Type 1 diabetes mellitus with diabetic autonomic (poly)neuropathy: Secondary | ICD-10-CM | POA: Diagnosis not present

## 2018-04-12 DIAGNOSIS — E785 Hyperlipidemia, unspecified: Secondary | ICD-10-CM | POA: Diagnosis not present

## 2018-04-13 DIAGNOSIS — J3081 Allergic rhinitis due to animal (cat) (dog) hair and dander: Secondary | ICD-10-CM | POA: Diagnosis not present

## 2018-04-13 DIAGNOSIS — J300 Vasomotor rhinitis: Secondary | ICD-10-CM | POA: Diagnosis not present

## 2018-04-13 LAB — CBC AND DIFFERENTIAL
HCT: 39 (ref 36–46)
Hemoglobin: 13.3 (ref 12.0–16.0)
WBC: 3.2

## 2018-04-13 LAB — TSH: TSH: 5.08 (ref 0.41–5.90)

## 2018-04-13 LAB — HEMOGLOBIN A1C: HEMOGLOBIN A1C: 7.9

## 2018-04-15 DIAGNOSIS — E039 Hypothyroidism, unspecified: Secondary | ICD-10-CM | POA: Diagnosis not present

## 2018-04-16 LAB — THYROXINE (T4) FREE, DIRECT
AG Ratio: 1.9
Alkaline Phosphatase: 72
SGOT(AST): 30
SGPT (ALT): 35
T3 Uptake: 48
Thyroxine (T4): 6.2
Total bilirubin, fluid: 0.5

## 2018-04-16 LAB — TSH: TSH: 4.86 (ref 0.41–5.90)

## 2018-04-27 DIAGNOSIS — J00 Acute nasopharyngitis [common cold]: Secondary | ICD-10-CM | POA: Diagnosis not present

## 2018-04-27 DIAGNOSIS — H6503 Acute serous otitis media, bilateral: Secondary | ICD-10-CM | POA: Diagnosis not present

## 2018-05-03 DIAGNOSIS — F3340 Major depressive disorder, recurrent, in remission, unspecified: Secondary | ICD-10-CM | POA: Diagnosis not present

## 2018-05-04 DIAGNOSIS — I10 Essential (primary) hypertension: Secondary | ICD-10-CM | POA: Diagnosis not present

## 2018-05-04 DIAGNOSIS — E103513 Type 1 diabetes mellitus with proliferative diabetic retinopathy with macular edema, bilateral: Secondary | ICD-10-CM | POA: Diagnosis not present

## 2018-05-04 DIAGNOSIS — E1065 Type 1 diabetes mellitus with hyperglycemia: Secondary | ICD-10-CM | POA: Diagnosis not present

## 2018-05-12 ENCOUNTER — Ambulatory Visit: Payer: Self-pay | Admitting: Family Medicine

## 2018-05-12 DIAGNOSIS — J029 Acute pharyngitis, unspecified: Secondary | ICD-10-CM | POA: Diagnosis not present

## 2018-05-12 DIAGNOSIS — B349 Viral infection, unspecified: Secondary | ICD-10-CM | POA: Diagnosis not present

## 2018-05-12 DIAGNOSIS — R197 Diarrhea, unspecified: Secondary | ICD-10-CM | POA: Diagnosis not present

## 2018-05-12 DIAGNOSIS — R739 Hyperglycemia, unspecified: Secondary | ICD-10-CM | POA: Diagnosis not present

## 2018-05-19 ENCOUNTER — Other Ambulatory Visit: Payer: Self-pay | Admitting: Family Medicine

## 2018-05-20 DIAGNOSIS — E1065 Type 1 diabetes mellitus with hyperglycemia: Secondary | ICD-10-CM | POA: Diagnosis not present

## 2018-05-20 DIAGNOSIS — E109 Type 1 diabetes mellitus without complications: Secondary | ICD-10-CM | POA: Diagnosis not present

## 2018-05-20 DIAGNOSIS — E10649 Type 1 diabetes mellitus with hypoglycemia without coma: Secondary | ICD-10-CM | POA: Diagnosis not present

## 2018-05-20 DIAGNOSIS — E103513 Type 1 diabetes mellitus with proliferative diabetic retinopathy with macular edema, bilateral: Secondary | ICD-10-CM | POA: Diagnosis not present

## 2018-05-20 DIAGNOSIS — E1043 Type 1 diabetes mellitus with diabetic autonomic (poly)neuropathy: Secondary | ICD-10-CM | POA: Diagnosis not present

## 2018-05-24 DIAGNOSIS — Y9301 Activity, walking, marching and hiking: Secondary | ICD-10-CM | POA: Diagnosis not present

## 2018-05-24 DIAGNOSIS — S42492A Other displaced fracture of lower end of left humerus, initial encounter for closed fracture: Secondary | ICD-10-CM | POA: Diagnosis not present

## 2018-05-24 DIAGNOSIS — E119 Type 2 diabetes mellitus without complications: Secondary | ICD-10-CM | POA: Diagnosis not present

## 2018-05-24 DIAGNOSIS — Z789 Other specified health status: Secondary | ICD-10-CM | POA: Diagnosis not present

## 2018-05-24 DIAGNOSIS — R296 Repeated falls: Secondary | ICD-10-CM | POA: Diagnosis not present

## 2018-05-24 DIAGNOSIS — S42212A Unspecified displaced fracture of surgical neck of left humerus, initial encounter for closed fracture: Secondary | ICD-10-CM | POA: Diagnosis not present

## 2018-05-24 DIAGNOSIS — Y929 Unspecified place or not applicable: Secondary | ICD-10-CM | POA: Diagnosis not present

## 2018-05-24 DIAGNOSIS — Y9289 Other specified places as the place of occurrence of the external cause: Secondary | ICD-10-CM | POA: Diagnosis not present

## 2018-05-24 DIAGNOSIS — W010XXA Fall on same level from slipping, tripping and stumbling without subsequent striking against object, initial encounter: Secondary | ICD-10-CM | POA: Diagnosis not present

## 2018-05-24 DIAGNOSIS — M25512 Pain in left shoulder: Secondary | ICD-10-CM | POA: Diagnosis not present

## 2018-05-24 DIAGNOSIS — Z9641 Presence of insulin pump (external) (internal): Secondary | ICD-10-CM | POA: Diagnosis not present

## 2018-05-24 DIAGNOSIS — W19XXXA Unspecified fall, initial encounter: Secondary | ICD-10-CM | POA: Diagnosis not present

## 2018-05-24 DIAGNOSIS — Z794 Long term (current) use of insulin: Secondary | ICD-10-CM | POA: Diagnosis not present

## 2018-05-24 DIAGNOSIS — Z0489 Encounter for examination and observation for other specified reasons: Secondary | ICD-10-CM | POA: Diagnosis not present

## 2018-05-24 DIAGNOSIS — S42291A Other displaced fracture of upper end of right humerus, initial encounter for closed fracture: Secondary | ICD-10-CM | POA: Diagnosis not present

## 2018-05-24 DIAGNOSIS — S42292A Other displaced fracture of upper end of left humerus, initial encounter for closed fracture: Secondary | ICD-10-CM | POA: Diagnosis not present

## 2018-05-25 DIAGNOSIS — Z0489 Encounter for examination and observation for other specified reasons: Secondary | ICD-10-CM | POA: Diagnosis not present

## 2018-05-25 DIAGNOSIS — S42212A Unspecified displaced fracture of surgical neck of left humerus, initial encounter for closed fracture: Secondary | ICD-10-CM | POA: Diagnosis not present

## 2018-05-25 DIAGNOSIS — R296 Repeated falls: Secondary | ICD-10-CM | POA: Diagnosis not present

## 2018-05-25 DIAGNOSIS — S42492A Other displaced fracture of lower end of left humerus, initial encounter for closed fracture: Secondary | ICD-10-CM | POA: Diagnosis not present

## 2018-05-31 DIAGNOSIS — W108XXA Fall (on) (from) other stairs and steps, initial encounter: Secondary | ICD-10-CM | POA: Diagnosis not present

## 2018-05-31 DIAGNOSIS — S42292A Other displaced fracture of upper end of left humerus, initial encounter for closed fracture: Secondary | ICD-10-CM | POA: Diagnosis not present

## 2018-06-15 ENCOUNTER — Other Ambulatory Visit: Payer: Self-pay | Admitting: Family Medicine

## 2018-06-15 NOTE — Telephone Encounter (Signed)
Must make appointment 

## 2018-06-20 DIAGNOSIS — F3340 Major depressive disorder, recurrent, in remission, unspecified: Secondary | ICD-10-CM | POA: Diagnosis not present

## 2018-06-24 ENCOUNTER — Ambulatory Visit: Payer: BLUE CROSS/BLUE SHIELD | Admitting: Sports Medicine

## 2018-07-11 ENCOUNTER — Telehealth: Payer: Self-pay | Admitting: Sports Medicine

## 2018-07-11 DIAGNOSIS — M797 Fibromyalgia: Secondary | ICD-10-CM

## 2018-07-11 NOTE — Telephone Encounter (Signed)
Before I prescribe it, she was on Lyrica 50 mg twice a day, that is a pain medication for fibromyalgia.  Was this dose good or if it was ineffective would she like me to go ahead and go up on the dose when I call in more.  She should already be doing Cymbalta.If persistent pain I also will go up to 60 mg of Cymbalta.  Let me know.

## 2018-07-11 NOTE — Telephone Encounter (Signed)
Pt. Called this afternoon. She stated that she spoke to Dr.T about pain meds for her fibromyalgia. She would like him to go ahead and prescribe those medications.

## 2018-07-12 MED ORDER — PREGABALIN 50 MG PO CAPS: 50.0000 mg | ORAL_CAPSULE | Freq: Two times a day (BID) | ORAL | 0 refills | Status: DC

## 2018-07-12 NOTE — Telephone Encounter (Signed)
Attempted to call, no and and no VM picked up

## 2018-07-12 NOTE — Telephone Encounter (Signed)
Patient advised.

## 2018-07-12 NOTE — Telephone Encounter (Signed)
Patient states she didn't start any of the medications. She states she wants to start over. She wants to know which medications she she start with. She never picked up an of the medications.

## 2018-07-12 NOTE — Telephone Encounter (Signed)
Lyrica sent in, we need to give it the due diligence of an up taper.  Starting at a low dose.

## 2018-07-21 ENCOUNTER — Encounter: Payer: Self-pay | Admitting: Family Medicine

## 2018-07-21 ENCOUNTER — Ambulatory Visit (INDEPENDENT_AMBULATORY_CARE_PROVIDER_SITE_OTHER): Payer: BLUE CROSS/BLUE SHIELD | Admitting: Family Medicine

## 2018-07-21 VITALS — BP 135/58 | HR 89 | Ht 67.0 in | Wt 150.0 lb

## 2018-07-21 DIAGNOSIS — Z0000 Encounter for general adult medical examination without abnormal findings: Secondary | ICD-10-CM | POA: Diagnosis not present

## 2018-07-21 DIAGNOSIS — Z23 Encounter for immunization: Secondary | ICD-10-CM

## 2018-07-21 DIAGNOSIS — I951 Orthostatic hypotension: Secondary | ICD-10-CM | POA: Diagnosis not present

## 2018-07-21 DIAGNOSIS — Z1159 Encounter for screening for other viral diseases: Secondary | ICD-10-CM

## 2018-07-21 DIAGNOSIS — E785 Hyperlipidemia, unspecified: Secondary | ICD-10-CM | POA: Diagnosis not present

## 2018-07-21 DIAGNOSIS — E038 Other specified hypothyroidism: Secondary | ICD-10-CM

## 2018-07-21 DIAGNOSIS — Z1231 Encounter for screening mammogram for malignant neoplasm of breast: Secondary | ICD-10-CM

## 2018-07-21 DIAGNOSIS — I361 Nonrheumatic tricuspid (valve) insufficiency: Secondary | ICD-10-CM | POA: Diagnosis not present

## 2018-07-21 DIAGNOSIS — I2510 Atherosclerotic heart disease of native coronary artery without angina pectoris: Secondary | ICD-10-CM | POA: Diagnosis not present

## 2018-07-21 MED ORDER — LEVOTHYROXINE SODIUM 150 MCG PO TABS: 150.0000 ug | ORAL_TABLET | Freq: Every day | ORAL | 1 refills | Status: DC

## 2018-07-21 NOTE — Progress Notes (Signed)
Subjective:     Alejandra Valdez is a 63 y.o. female and is here for a comprehensive physical exam. The patient reports no problems.  Is doing well overall.  She more recently started a low-carb diet and has lost a lot of weight.  She says her diabetes is actually been really well controlled she has an insulin pump.  She does follow with endocrinology.  Does walk for exercise but says it is at a low pace.  Did bring in some labs that were done a couple of months ago for me to review including a CMP, TSH and CBC.  Social History   Socioeconomic History  . Marital status: Married    Spouse name: bobby   . Number of children: 1  . Years of education: Not on file  . Highest education level: Not on file  Occupational History  . Occupation: Disabled  Social Needs  . Financial resource strain: Not on file  . Food insecurity:    Worry: Not on file    Inability: Not on file  . Transportation needs:    Medical: Not on file    Non-medical: Not on file  Tobacco Use  . Smoking status: Never Smoker  . Smokeless tobacco: Never Used  Substance and Sexual Activity  . Alcohol use: No    Alcohol/week: 0.0 standard drinks  . Drug use: No  . Sexual activity: Never    Partners: Male  Lifestyle  . Physical activity:    Days per week: Not on file    Minutes per session: Not on file  . Stress: Not on file  Relationships  . Social connections:    Talks on phone: Not on file    Gets together: Not on file    Attends religious service: Not on file    Active member of club or organization: Not on file    Attends meetings of clubs or organizations: Not on file    Relationship status: Not on file  . Intimate partner violence:    Fear of current or ex partner: Not on file    Emotionally abused: Not on file    Physically abused: Not on file    Forced sexual activity: Not on file  Other Topics Concern  . Not on file  Social History Narrative   No daily caffeine intake. Walks for 30 minutes for exercise.  She is currently disabled.   Health Maintenance  Topic Date Due  . Hepatitis C Screening  02/03/1956  . FOOT EXAM  05/18/1968  . HIV Screening  05/18/1973  . OPHTHALMOLOGY EXAM  02/13/2016  . URINE MICROALBUMIN  07/29/2016  . HEMOGLOBIN A1C  01/17/2017  . TETANUS/TDAP  06/30/2022  . COLONOSCOPY  07/05/2022  . INFLUENZA VACCINE  Completed  . PNEUMOCOCCAL POLYSACCHARIDE VACCINE AGE 15-64 HIGH RISK  Completed    The following portions of the patient's history were reviewed and updated as appropriate: allergies, current medications, past family history, past medical history, past social history, past surgical history and problem list.  Review of Systems A comprehensive review of systems was negative.   Objective:    There were no vitals taken for this visit. General appearance: alert, cooperative and appears stated age Head: Normocephalic, without obvious abnormality, atraumatic Eyes: conj clear, EOMi, PEERLA Ears: normal TM's and external ear canals both ears Nose: Nares normal. Septum midline. Mucosa normal. No drainage or sinus tenderness. Throat: lips, mucosa, and tongue normal; teeth and gums normal Neck: no adenopathy, no carotid bruit, no  JVD, supple, symmetrical, trachea midline and thyroid not enlarged, symmetric, no tenderness/mass/nodules Back: symmetric, no curvature. ROM normal. No CVA tenderness. Lungs: clear to auscultation bilaterally Breasts: Incisions well-healed over both breasts.  Breast implants bilaterally.  No palpable masses or lymphadenopathy in the axilla. Heart: regular rate and rhythm, S1, S2 normal, no murmur, click, rub or gallop Abdomen: soft, non-tender; bowel sounds normal; no masses,  no organomegaly Extremities: extremities normal, atraumatic, no cyanosis or edema Pulses: 2+ and symmetric Skin: Skin color, texture, turgor normal. No rashes or lesions Lymph nodes: Cervical, supraclavicular, and axillary nodes normal. Neurologic: Alert and oriented  X 3, normal strength and tone. Normal symmetric reflexes. Normal coordination and gait    Assessment:    Healthy female exam.     Plan:     See After Visit Summary for Counseling Recommendations   Keep up a regular exercise program and make sure you are eating a healthy diet Try to eat 4 servings of dairy a day, or if you are lactose intolerant take a calcium with vitamin D daily.  Your vaccines are up to date.  Shingles vaccine given today. Did bring in some lab work from September so we will get that entered into the system.  Hypothyroidism-TSH elevated around 5.  Will increase her levothyroxine to 150 mcg daily and plan to recheck levels in 6 weeks.  We will also go ahead and get vitamin D level at that time.  She has been taking her vitamin D supplement.

## 2018-07-21 NOTE — Patient Instructions (Signed)

## 2018-07-22 DIAGNOSIS — G3184 Mild cognitive impairment, so stated: Secondary | ICD-10-CM | POA: Diagnosis not present

## 2018-07-22 DIAGNOSIS — E1042 Type 1 diabetes mellitus with diabetic polyneuropathy: Secondary | ICD-10-CM | POA: Diagnosis not present

## 2018-07-22 DIAGNOSIS — R4182 Altered mental status, unspecified: Secondary | ICD-10-CM | POA: Diagnosis not present

## 2018-07-22 DIAGNOSIS — G43719 Chronic migraine without aura, intractable, without status migrainosus: Secondary | ICD-10-CM | POA: Diagnosis not present

## 2018-08-02 ENCOUNTER — Ambulatory Visit: Payer: Self-pay | Admitting: Sports Medicine

## 2018-08-08 ENCOUNTER — Telehealth: Payer: Self-pay

## 2018-08-08 DIAGNOSIS — Z0000 Encounter for general adult medical examination without abnormal findings: Secondary | ICD-10-CM | POA: Diagnosis not present

## 2018-08-08 DIAGNOSIS — E039 Hypothyroidism, unspecified: Secondary | ICD-10-CM | POA: Diagnosis not present

## 2018-08-08 DIAGNOSIS — E103513 Type 1 diabetes mellitus with proliferative diabetic retinopathy with macular edema, bilateral: Secondary | ICD-10-CM | POA: Diagnosis not present

## 2018-08-08 DIAGNOSIS — I10 Essential (primary) hypertension: Secondary | ICD-10-CM | POA: Diagnosis not present

## 2018-08-08 DIAGNOSIS — E038 Other specified hypothyroidism: Secondary | ICD-10-CM | POA: Diagnosis not present

## 2018-08-08 DIAGNOSIS — Z1159 Encounter for screening for other viral diseases: Secondary | ICD-10-CM | POA: Diagnosis not present

## 2018-08-08 DIAGNOSIS — E559 Vitamin D deficiency, unspecified: Secondary | ICD-10-CM | POA: Diagnosis not present

## 2018-08-08 NOTE — Telephone Encounter (Signed)
Margret stopped by and is very anxious about having osteopenia. She would like a bone building medication.

## 2018-08-09 LAB — HEPATITIS C ANTIBODY
Hepatitis C Ab: NONREACTIVE
SIGNAL TO CUT-OFF: 0.01 (ref <1.00)

## 2018-08-09 LAB — T4, FREE: Free T4: 0.7 ng/dL — ABNORMAL LOW (ref 0.8–1.8)

## 2018-08-09 LAB — TSH: TSH: 7.17 mIU/L — ABNORMAL HIGH (ref 0.40–4.50)

## 2018-08-09 LAB — VITAMIN D 25 HYDROXY (VIT D DEFICIENCY, FRACTURES): Vit D, 25-Hydroxy: 36 ng/mL (ref 30–100)

## 2018-08-09 MED ORDER — ALENDRONATE SODIUM 70 MG PO TABS: 70.0000 mg | ORAL_TABLET | ORAL | 3 refills | Status: DC

## 2018-08-09 NOTE — Telephone Encounter (Signed)
Pt advised. No further questions or concerns at this time.    

## 2018-08-09 NOTE — Telephone Encounter (Signed)
OK, rx sent for fosamax.

## 2018-08-11 ENCOUNTER — Encounter: Payer: Self-pay | Admitting: *Deleted

## 2018-08-12 ENCOUNTER — Other Ambulatory Visit: Payer: Self-pay | Admitting: *Deleted

## 2018-08-15 ENCOUNTER — Other Ambulatory Visit: Payer: Self-pay | Admitting: Family Medicine

## 2018-08-22 ENCOUNTER — Encounter: Payer: Self-pay | Admitting: *Deleted

## 2018-08-22 DIAGNOSIS — E109 Type 1 diabetes mellitus without complications: Secondary | ICD-10-CM | POA: Diagnosis not present

## 2018-08-22 DIAGNOSIS — E103513 Type 1 diabetes mellitus with proliferative diabetic retinopathy with macular edema, bilateral: Secondary | ICD-10-CM | POA: Diagnosis not present

## 2018-08-22 DIAGNOSIS — E10649 Type 1 diabetes mellitus with hypoglycemia without coma: Secondary | ICD-10-CM | POA: Diagnosis not present

## 2018-08-22 DIAGNOSIS — E1043 Type 1 diabetes mellitus with diabetic autonomic (poly)neuropathy: Secondary | ICD-10-CM | POA: Diagnosis not present

## 2018-08-22 DIAGNOSIS — E1065 Type 1 diabetes mellitus with hyperglycemia: Secondary | ICD-10-CM | POA: Diagnosis not present

## 2018-08-31 ENCOUNTER — Telehealth: Payer: Self-pay

## 2018-08-31 DIAGNOSIS — E038 Other specified hypothyroidism: Secondary | ICD-10-CM

## 2018-08-31 NOTE — Telephone Encounter (Signed)
Alejandra Valdez would like information on her Thyroid. I tried to read the message from the lab work. She got all worked up and told me I was wrong and that we need not to drop the ball on this because it is her health. Please call patient.

## 2018-08-31 NOTE — Telephone Encounter (Signed)
Called pt back and spoke w/her husband in regards to her thyroid medication. I informed him that according to her records her thyroid is being managed by her endocrinologist at [facility]. And they changed her medication to 137 mcg at her last OV. I told him that I had tried to call her several times and was unsuccessful in my attempts and that she does not have VM and after so many failed attempts to reach a pt we send a letter to the pt informing them of our attempts to contact and what our findings are.   He stated that she would really like for Dr. Madilyn Fireman to handle her thyroid medication due to the fact that the endocrinologist is about to go out on maternity leave. He said that the 150 mcg caused her to have hot flashes, and palpitations. He wanted to know if she should do something between the 137 and 150 mcg? I told him that I would fwd this to Dr. Madilyn Fireman.   He also stated that if we could not reach her by her phone # to call him. Alejandra Valdez, Lahoma Crocker, CMA

## 2018-09-01 NOTE — Telephone Encounter (Signed)
Since she is taking 147mcg daily then I would like to go inbetween.  I would like for her to take 177mcg on Mondays and Thursdays and take the 129mcg the other 5 days of the week. Repeat each week and then recheck TSH in 6 weeks to make sure adjustment is working.

## 2018-09-01 NOTE — Telephone Encounter (Signed)
Recommendations given to patient. She repeated this back to me and understood. Will fax lab for her to have done in 6 wks.Maryruth Eve, Lahoma Crocker, CMA

## 2018-09-02 ENCOUNTER — Other Ambulatory Visit: Payer: Self-pay | Admitting: Cardiology

## 2018-09-06 ENCOUNTER — Encounter: Payer: Self-pay | Admitting: Family Medicine

## 2018-09-12 ENCOUNTER — Other Ambulatory Visit: Payer: Self-pay | Admitting: Family Medicine

## 2018-09-19 DIAGNOSIS — E103593 Type 1 diabetes mellitus with proliferative diabetic retinopathy without macular edema, bilateral: Secondary | ICD-10-CM | POA: Diagnosis not present

## 2018-09-20 DIAGNOSIS — F3340 Major depressive disorder, recurrent, in remission, unspecified: Secondary | ICD-10-CM | POA: Diagnosis not present

## 2018-09-23 DIAGNOSIS — E1043 Type 1 diabetes mellitus with diabetic autonomic (poly)neuropathy: Secondary | ICD-10-CM | POA: Diagnosis not present

## 2018-09-23 DIAGNOSIS — E10649 Type 1 diabetes mellitus with hypoglycemia without coma: Secondary | ICD-10-CM | POA: Diagnosis not present

## 2018-09-23 DIAGNOSIS — E1065 Type 1 diabetes mellitus with hyperglycemia: Secondary | ICD-10-CM | POA: Diagnosis not present

## 2018-09-23 DIAGNOSIS — E103513 Type 1 diabetes mellitus with proliferative diabetic retinopathy with macular edema, bilateral: Secondary | ICD-10-CM | POA: Diagnosis not present

## 2018-09-29 ENCOUNTER — Other Ambulatory Visit: Payer: Self-pay | Admitting: Family Medicine

## 2018-09-29 DIAGNOSIS — E038 Other specified hypothyroidism: Secondary | ICD-10-CM

## 2018-09-29 DIAGNOSIS — F3340 Major depressive disorder, recurrent, in remission, unspecified: Secondary | ICD-10-CM | POA: Diagnosis not present

## 2018-10-06 ENCOUNTER — Encounter: Payer: Self-pay | Admitting: Cardiology

## 2018-10-07 DIAGNOSIS — B351 Tinea unguium: Secondary | ICD-10-CM | POA: Diagnosis not present

## 2018-10-07 DIAGNOSIS — L603 Nail dystrophy: Secondary | ICD-10-CM | POA: Diagnosis not present

## 2018-10-07 DIAGNOSIS — L600 Ingrowing nail: Secondary | ICD-10-CM | POA: Diagnosis not present

## 2018-10-07 DIAGNOSIS — E119 Type 2 diabetes mellitus without complications: Secondary | ICD-10-CM | POA: Diagnosis not present

## 2018-10-10 ENCOUNTER — Encounter: Payer: Self-pay | Admitting: Cardiology

## 2018-10-10 ENCOUNTER — Ambulatory Visit: Payer: BLUE CROSS/BLUE SHIELD | Admitting: Cardiology

## 2018-10-10 ENCOUNTER — Other Ambulatory Visit: Payer: Self-pay

## 2018-10-10 DIAGNOSIS — I209 Angina pectoris, unspecified: Secondary | ICD-10-CM

## 2018-10-10 DIAGNOSIS — I25118 Atherosclerotic heart disease of native coronary artery with other forms of angina pectoris: Secondary | ICD-10-CM

## 2018-10-10 DIAGNOSIS — Z951 Presence of aortocoronary bypass graft: Secondary | ICD-10-CM

## 2018-10-10 DIAGNOSIS — E1043 Type 1 diabetes mellitus with diabetic autonomic (poly)neuropathy: Secondary | ICD-10-CM

## 2018-10-10 DIAGNOSIS — E7800 Pure hypercholesterolemia, unspecified: Secondary | ICD-10-CM

## 2018-10-10 NOTE — Progress Notes (Signed)
Virtual Visit via Video Note: This visit type was conducted due to national recommendations for restrictions regarding the COVID-19 Pandemic (e.g. social distancing).  This format is felt to be most appropriate for this patient at this time.  All issues noted in this document were discussed and addressed.  No physical exam was performed (except for noted visual exam findings with Telehealth visits).  The patient has consented to conduct a Telehealth visit and understands insurance will be billed.   I connected with@, on 10/10/18 at  by a video enabled telemedicine application and verified that I am speaking with the correct person using two identifiers.   I discussed the limitations of evaluation and management by telemedicine and the availability of in person appointments. The patient expressed understanding and agreed to proceed.   I have discussed with patient regarding the safety during COVID Pandemic and steps and precautions to be taken including social distancing, frequent hand wash and use of detergent soap, gels with the patient. I asked the patient to avoid touching mouth, nose, eyes, ears with the hands. I encouraged regular walking around the neighborhood and exercise and regular diet, as long as social distancing can be maintained.   Subjective:  Primary Physician/Referring:  Hali Marry, MD  Patient ID: Alejandra Valdez, female    DOB: 05-13-1956, 63 y.o.   MRN: [id number]  Chief Complaint  Patient presents with  . Coronary Artery Disease  . Follow-up    HPI: Alejandra Valdez  is a 63 y.o. female  with orthostatic hypotension with symptomatic improvement with fludrocortisone, coronary artery disease with CABG 4 in 2013 in [place] when she suffered MI in 2013 S/P CABG 2013 with LIMA to LAD, SVG to RCA and SVG to OM1 and D2. She has type 1 diabetes, hypothyroidism, hyperlipidemia, depression, anxiety, history of breast cancer with bilateral mastectomy, osteoporosis and chronic  back pain.  This is a six-month office visit, states that about a month ago she had chest tightness with radiation to her neck. Patient had called our office on 07/02/2018 complaining of chest discomfort with radiation to the arm, I want to see her in the office but however patient states that she had done well and has brought in a dairy of events. Otherwise no new symptoms, has been fairly active and has lost weight with exercise and diet, diabetes is well controlled. Chronic neck pain and has been bothering her.  She states she is doing well and recently started on new insulin pump  her BP is more stable.   Past Medical History:  Diagnosis Date  . ADHD (attention deficit hyperactivity disorder)   . Anxiety   . Arthritis    Of spine DDD/DJD  . CAD (coronary artery disease)    a. s/p CABG in 2013 with LIMA-LAD, SVG-RCA, and SVG-OM1-D2  . Cancer [facility]) 2012   Breast  . Depression   . Diabetes mellitus without complication ([place])   . H/O bilateral breast implants 01/24/2016  . History of colon polyps   . Hypothyroid   . Lung nodule    Stable per CT 05/2013  . Migraine   . Myocardial infarction ([place]) 2013  . Neuropathy   . Osteoporosis     Past Surgical History:  Procedure Laterality Date  . ABDOMINAL HYSTERECTOMY     has left ovary  . APPENDECTOMY    . BILATERAL CARPAL TUNNEL RELEASE    . BLEPHAROPLASTY Bilateral   . CATARACT EXTRACTION Bilateral   . CESAREAN SECTION  x2  . CORONARY ARTERY BYPASS GRAFT  2013   x4  . MASTECTOMY Bilateral 2012  . OTHER SURGICAL HISTORY     Retinal repair  . SHOULDER SURGERY Bilateral     Social History   Socioeconomic History  . Marital status: Divorced    Spouse name: Not on file  . Number of children: 1  . Years of education: Not on file  . Highest education level: Not on file  Occupational History  . Occupation: Disabled  Social Needs  . Financial resource strain: Not on file  . Food insecurity:    Worry: Not on file     Inability: Not on file  . Transportation needs:    Medical: Not on file    Non-medical: Not on file  Tobacco Use  . Smoking status: Never Smoker  . Smokeless tobacco: Never Used  Substance and Sexual Activity  . Alcohol use: No    Alcohol/week: 0.0 standard drinks  . Drug use: No  . Sexual activity: Never    Partners: Male  Lifestyle  . Physical activity:    Days per week: Not on file    Minutes per session: Not on file  . Stress: Not on file  Relationships  . Social connections:    Talks on phone: Not on file    Gets together: Not on file    Attends religious service: Not on file    Active member of club or organization: Not on file    Attends meetings of clubs or organizations: Not on file    Relationship status: Not on file  . Intimate partner violence:    Fear of current or ex partner: Not on file    Emotionally abused: Not on file    Physically abused: Not on file    Forced sexual activity: Not on file  Other Topics Concern  . Not on file  Social History Narrative   No daily caffeine intake. Walks for 30 minutes for exercise. She is currently disabled.    Current Outpatient Medications on File Prior to Visit  Medication Sig Dispense Refill  . alendronate (FOSAMAX) 70 MG tablet Take 1 tablet (70 mg total) by mouth every 7 (seven) days. Take with a full glass of water on an empty stomach. 12 tablet 3  . aspirin EC 81 MG tablet Take 1 tablet (81 mg total) by mouth daily. 90 tablet 3  . Botulinum Toxin Type A 200 units SOLR Inject as directed. Once every 3 months for migraines    . celecoxib (CELEBREX) 200 MG capsule Take 1 capsule (200 mg total) by mouth daily. *NO ADDITIONAL REFILLS. PLEASE CALL THE OFFICE TO SCHEDULE AN APPOINTMENT* 30 capsule 0  . cholecalciferol (VITAMIN D3) 25 MCG (1000 UT) tablet Take 1,000 Units by mouth daily.    . clonazePAM (KLONOPIN) 1 MG tablet Take 1 mg by mouth 2 (two) times daily.    . cycloSPORINE (RESTASIS) 0.05 % ophthalmic emulsion  Place 1 drop into both eyes 2 (two) times daily. 1 each 1  . DULoxetine (CYMBALTA) 30 MG capsule TAKE 1 CAPSULE(30 MG) BY MOUTH DAILY 30 capsule 0  . ezetimibe (ZETIA) 10 MG tablet TAKE 1 TABLET(10 MG) BY MOUTH DAILY 30 tablet 0  . ibandronate (BONIVA) 150 MG tablet TAKE 1 TABLET BY MOUTH EVERY 30 DAYS 3 tablet 0  . insulin aspart (NOVOLOG) 100 UNIT/ML injection Use up to 75 units daily by insulin pump. 30 mL 3  . lamoTRIgine (LAMICTAL) 100 MG tablet  Take 1 tablet (100 mg total) daily by mouth. 30 tablet 1  . LATUDA 20 MG TABS tablet TAKE 1 TABLET(20 MG) BY MOUTH AT BEDTIME 30 tablet 1  . levothyroxine (SYNTHROID, LEVOTHROID) 137 MCG tablet Take 1 tablet by mouth daily. By itself on an empty stomach    . meloxicam (MOBIC) 15 MG tablet TAKE 1 TABLET BY MOUTH EVERY MORNING WITH BREAKFAST FOR 2 WEEKS AS NEEDED FOR PAIN 30 tablet 0  . metoprolol succinate (TOPROL-XL) 25 MG 24 hr tablet TAKE 1 TABLET BY MOUTH DAILY 30 tablet 0  . nitroGLYCERIN (NITROSTAT) 0.4 MG SL tablet DISSOLVE 1 TABLET EVERY Q 5 MIN AS NEEDED FOR CHEST PAIN    . nortriptyline (PAMELOR) 10 MG capsule TAKE 1 CAPSULE(10 MG) BY MOUTH THREE TIMES DAILY. FOLLOW UP APPOINTMENT BEFORE MORE REFILLS (Patient taking differently: TAKE 1 CAPSULE(10 MG) BY MOUTH at bedtime) 30 capsule 0  . omeprazole (PRILOSEC) 40 MG capsule TAKE 1 CAPSULE BY MOUTH EVERY DAY 90 capsule 0  . simvastatin (ZOCOR) 40 MG tablet TAKE 1 TABLET BY MOUTH DAILY AT 6 PM**SCHEDULE APPT FOR FUTURE REFILLS** 30 tablet 0  . traZODone (DESYREL) 100 MG tablet Take 2 tablets (200 mg total) by mouth at bedtime. 180 tablet 1  . Acetone, Urine, Test (KETONE TEST) STRP by Does not apply route.    Marland Kitchen BAYER CONTOUR NEXT TEST test strip 150 each as needed.  3  . naratriptan (AMERGE) 2.5 MG tablet Take 2.5 mg by mouth. Take one tablet at onset of migraine do not take more than 1 a day  0  . [DISCONTINUED] temazepam (RESTORIL) 15 MG capsule TK ONE C PO QHS  1   No current  facility-administered medications on file prior to visit.     Review of Systems  Constitution: Negative for chills, decreased appetite, malaise/fatigue and weight gain.  Cardiovascular: Positive for chest pain (Has used occasional NTG) and palpitations (occasional). Negative for dyspnea on exertion, leg swelling and syncope.  Endocrine: Negative for cold intolerance.  Hematologic/Lymphatic: Does not bruise/bleed easily.  Musculoskeletal: Positive for joint pain (LEFT BIG TOE INJURY AND NAIL OUT I3 DAYS AGO). Negative for joint swelling.  Gastrointestinal: Negative for abdominal pain, anorexia and change in bowel habit.  Neurological: Negative for headaches and light-headedness.  Psychiatric/Behavioral: Negative for depression and substance abuse.  All other systems reviewed and are negative.     Objective:  There were no vitals taken for this visit. There is no height or weight on file to calculate BMI.  Not performed. Completely and video exam only Physical Exam  Constitutional: She is oriented to person, place, and time. She appears well-nourished.  Eyes: Conjunctivae are normal.  Neck: Neck supple.  Pulmonary/Chest: Effort normal.  Neurological: She is alert and oriented to person, place, and time.    Radiology: No results found.  Laboratory Examination:  CMP Latest Ref Rng & Units 04/15/2018 07/30/2017 05/28/2016  Glucose 65 - 99 mg/dL - 150(H) 80  BUN 6 - 20 mg/dL - 25(H) 12  Creatinine 0.44 - 1.00 mg/dL - 0.85 0.72  Sodium 135 - 145 mmol/L - 138 142  Potassium 3.5 - 5.1 mmol/L - 3.6 4.3  Chloride 101 - 111 mmol/L - 107 109  CO2 22 - 32 mmol/L - 25 21  Calcium 8.9 - 10.3 mg/dL - 8.7(L) 9.0  Total Protein 6.1 - 8.1 g/dL - - 6.0(L)  Total Bilirubin 0.2 - 1.2 mg/dL - - 0.5  Alkaline Phos - 72 - 52  AST - 30 - 25  ALT - 35 - 17   CBC Latest Ref Rng & Units 04/12/2018 04/11/2015  WBC - 3.2 4.3  Hemoglobin 12.0 - 16.0 13.3 14.6  Hematocrit 36 - 46 39 44.4  Platelets 150  - 400 K/uL - 253   Lipid Panel     Component Value Date/Time   CHOL 123 05/28/2016 1527   TRIG 79 05/28/2016 1527   HDL 53 05/28/2016 1527   CHOLHDL 2.3 05/28/2016 1527   VLDL 16 05/28/2016 1527   LDLCALC 54 05/28/2016 1527   HEMOGLOBIN A1C Lab Results  Component Value Date   HGBA1C 7.9 04/12/2018   MPG 217 (H) 04/11/2015   TSH Recent Labs    04/12/18 04/15/18 08/08/18 1556  TSH 5.08 4.86 7.17*     Cardiac studies:   Lexiscan Myoview stress test 05/11/2016:  Normal EF, no evidence of ischemia, low risk study  Echocardiogram 11/30/2016:  Normal LV systolic function, EF 63-89%.  No wall motion of mortality.  Pseudo-normal LV diastolic function.  No significant valvular abnormality.  Assessment:    Angina pectoris ([place])  Coronary artery disease of native artery of native heart with stable angina pectoris ([place])  Hx of CABG  2013: LIMA to LAD, SVG to RCA and SVG to OM1 and D2.  Type I diabetes mellitus with peripheral autonomic neuropathy ([facility])  Hypercholesteremia  EKG 07/18/2018: Normal sinus rhythm at rate of 87 bpm, left atrial enlargement, incomplete right bundle branch block.  No evidence of ischemia otherwise normal EKG. No significant change from EKG 04/11/2018: Normal sinus rhythm at rate of 80 bpm, normal axis.   Recommendations:    She is presently doing well and has class II stable angina pectoris.  No clinical evidence to suggest congestive heart failure by history.  Although she has diabetic autonomic neuropathy and orthostatic hypotension, symptoms or dizziness have stabilized.  Her blood sugar is also improved since changing to new diabetes insulin pump.  Cholesterol is well controlled, presently on appropriate statin therapy.  Overall she remained stable from cardiac standpoint, I will see her back in 3 months for follow-up in the office after Covid issues are resolved.  We again discussed hand hygiene and social distancing.  Adrian Prows, MD, [facility]  10/10/2018, 9:57 PM [facility] [place] Pager: [phone] Office: [phone] If no answer Cell [phone]

## 2018-10-12 DIAGNOSIS — E1065 Type 1 diabetes mellitus with hyperglycemia: Secondary | ICD-10-CM | POA: Diagnosis not present

## 2018-10-12 DIAGNOSIS — E103513 Type 1 diabetes mellitus with proliferative diabetic retinopathy with macular edema, bilateral: Secondary | ICD-10-CM | POA: Diagnosis not present

## 2018-10-12 DIAGNOSIS — Z794 Long term (current) use of insulin: Secondary | ICD-10-CM | POA: Diagnosis not present

## 2018-10-12 DIAGNOSIS — E10649 Type 1 diabetes mellitus with hypoglycemia without coma: Secondary | ICD-10-CM | POA: Diagnosis not present

## 2018-10-12 DIAGNOSIS — E1043 Type 1 diabetes mellitus with diabetic autonomic (poly)neuropathy: Secondary | ICD-10-CM | POA: Diagnosis not present

## 2018-10-12 DIAGNOSIS — E109 Type 1 diabetes mellitus without complications: Secondary | ICD-10-CM | POA: Diagnosis not present

## 2018-10-17 DIAGNOSIS — F3340 Major depressive disorder, recurrent, in remission, unspecified: Secondary | ICD-10-CM | POA: Diagnosis not present

## 2018-10-21 DIAGNOSIS — E1042 Type 1 diabetes mellitus with diabetic polyneuropathy: Secondary | ICD-10-CM | POA: Diagnosis not present

## 2018-10-21 DIAGNOSIS — R4182 Altered mental status, unspecified: Secondary | ICD-10-CM | POA: Diagnosis not present

## 2018-10-21 DIAGNOSIS — G43719 Chronic migraine without aura, intractable, without status migrainosus: Secondary | ICD-10-CM | POA: Diagnosis not present

## 2018-10-21 DIAGNOSIS — R413 Other amnesia: Secondary | ICD-10-CM | POA: Diagnosis not present

## 2018-10-21 DIAGNOSIS — E559 Vitamin D deficiency, unspecified: Secondary | ICD-10-CM | POA: Diagnosis not present

## 2018-10-21 DIAGNOSIS — Z79899 Other long term (current) drug therapy: Secondary | ICD-10-CM | POA: Diagnosis not present

## 2018-10-21 DIAGNOSIS — I2510 Atherosclerotic heart disease of native coronary artery without angina pectoris: Secondary | ICD-10-CM | POA: Diagnosis not present

## 2018-10-28 DIAGNOSIS — E119 Type 2 diabetes mellitus without complications: Secondary | ICD-10-CM | POA: Diagnosis not present

## 2018-10-28 DIAGNOSIS — M2042 Other hammer toe(s) (acquired), left foot: Secondary | ICD-10-CM | POA: Diagnosis not present

## 2018-10-28 DIAGNOSIS — B351 Tinea unguium: Secondary | ICD-10-CM | POA: Diagnosis not present

## 2018-10-28 DIAGNOSIS — M2041 Other hammer toe(s) (acquired), right foot: Secondary | ICD-10-CM | POA: Diagnosis not present

## 2018-10-28 DIAGNOSIS — L600 Ingrowing nail: Secondary | ICD-10-CM | POA: Diagnosis not present

## 2018-10-28 DIAGNOSIS — L603 Nail dystrophy: Secondary | ICD-10-CM | POA: Diagnosis not present

## 2018-11-03 ENCOUNTER — Telehealth: Payer: Self-pay | Admitting: Family Medicine

## 2018-11-03 DIAGNOSIS — E038 Other specified hypothyroidism: Secondary | ICD-10-CM

## 2018-11-03 NOTE — Telephone Encounter (Signed)
Needs order to recheck thyroid

## 2018-11-08 DIAGNOSIS — E1069 Type 1 diabetes mellitus with other specified complication: Secondary | ICD-10-CM | POA: Diagnosis not present

## 2018-11-08 DIAGNOSIS — E103513 Type 1 diabetes mellitus with proliferative diabetic retinopathy with macular edema, bilateral: Secondary | ICD-10-CM | POA: Diagnosis not present

## 2018-11-08 DIAGNOSIS — E785 Hyperlipidemia, unspecified: Secondary | ICD-10-CM | POA: Diagnosis not present

## 2018-11-08 DIAGNOSIS — E039 Hypothyroidism, unspecified: Secondary | ICD-10-CM | POA: Diagnosis not present

## 2018-11-08 DIAGNOSIS — E1065 Type 1 diabetes mellitus with hyperglycemia: Secondary | ICD-10-CM | POA: Diagnosis not present

## 2018-11-09 ENCOUNTER — Ambulatory Visit: Payer: BLUE CROSS/BLUE SHIELD | Admitting: Sports Medicine

## 2018-11-10 ENCOUNTER — Other Ambulatory Visit: Payer: Self-pay

## 2018-11-10 ENCOUNTER — Telehealth: Payer: Self-pay | Admitting: Family Medicine

## 2018-11-10 DIAGNOSIS — E1059 Type 1 diabetes mellitus with other circulatory complications: Secondary | ICD-10-CM

## 2018-11-10 DIAGNOSIS — E038 Other specified hypothyroidism: Secondary | ICD-10-CM | POA: Diagnosis not present

## 2018-11-10 NOTE — Telephone Encounter (Signed)
Once labs come back patient would like them faxed to her endocrinologist, Dr. Grayce Sessions. She did not have the fax number but the phone is [phone].

## 2018-11-10 NOTE — Progress Notes (Signed)
Orders placed per PCP

## 2018-11-11 ENCOUNTER — Other Ambulatory Visit: Payer: Self-pay | Admitting: Family Medicine

## 2018-11-11 LAB — HEMOGLOBIN A1C
Hgb A1c MFr Bld: 7.3 % of total Hgb — ABNORMAL HIGH (ref <5.7)
Mean Plasma Glucose: 163 (calc)
eAG (mmol/L): 9 (calc)

## 2018-11-11 LAB — T4, FREE: Free T4: 1.8 ng/dL (ref 0.8–1.8)

## 2018-11-11 LAB — TSH+FREE T4: TSH W/REFLEX TO FT4: 0.04 mIU/L — ABNORMAL LOW (ref 0.40–4.50)

## 2018-11-11 LAB — URIC ACID: Uric Acid, Serum: 3.8 mg/dL (ref 2.5–7.0)

## 2018-11-14 ENCOUNTER — Other Ambulatory Visit: Payer: Self-pay | Admitting: Family Medicine

## 2018-11-14 ENCOUNTER — Other Ambulatory Visit: Payer: Self-pay | Admitting: *Deleted

## 2018-11-14 DIAGNOSIS — E038 Other specified hypothyroidism: Secondary | ICD-10-CM

## 2018-11-14 MED ORDER — LEVOTHYROXINE SODIUM 125 MCG PO TABS: 125.0000 ug | ORAL_TABLET | ORAL | 0 refills | Status: DC

## 2018-11-22 ENCOUNTER — Other Ambulatory Visit: Payer: Self-pay | Admitting: Cardiology

## 2018-11-22 NOTE — Telephone Encounter (Signed)
Can you clarify if patient is on this? Was not listed on her medication list last office visit

## 2018-11-22 NOTE — Telephone Encounter (Signed)
Yes she takes this

## 2018-11-24 DIAGNOSIS — R4182 Altered mental status, unspecified: Secondary | ICD-10-CM | POA: Diagnosis not present

## 2018-11-25 DIAGNOSIS — R4182 Altered mental status, unspecified: Secondary | ICD-10-CM | POA: Diagnosis not present

## 2018-11-25 DIAGNOSIS — L600 Ingrowing nail: Secondary | ICD-10-CM | POA: Diagnosis not present

## 2018-11-25 DIAGNOSIS — M2041 Other hammer toe(s) (acquired), right foot: Secondary | ICD-10-CM | POA: Diagnosis not present

## 2018-11-25 DIAGNOSIS — E119 Type 2 diabetes mellitus without complications: Secondary | ICD-10-CM | POA: Diagnosis not present

## 2018-11-25 DIAGNOSIS — M2042 Other hammer toe(s) (acquired), left foot: Secondary | ICD-10-CM | POA: Diagnosis not present

## 2018-11-25 DIAGNOSIS — B351 Tinea unguium: Secondary | ICD-10-CM | POA: Diagnosis not present

## 2018-11-26 DIAGNOSIS — R4182 Altered mental status, unspecified: Secondary | ICD-10-CM | POA: Diagnosis not present

## 2018-12-08 DIAGNOSIS — R4182 Altered mental status, unspecified: Secondary | ICD-10-CM | POA: Diagnosis not present

## 2018-12-09 DIAGNOSIS — L600 Ingrowing nail: Secondary | ICD-10-CM | POA: Diagnosis not present

## 2018-12-09 DIAGNOSIS — B351 Tinea unguium: Secondary | ICD-10-CM | POA: Diagnosis not present

## 2018-12-09 DIAGNOSIS — E119 Type 2 diabetes mellitus without complications: Secondary | ICD-10-CM | POA: Diagnosis not present

## 2018-12-13 ENCOUNTER — Other Ambulatory Visit: Payer: Self-pay | Admitting: Family Medicine

## 2018-12-27 DIAGNOSIS — Z23 Encounter for immunization: Secondary | ICD-10-CM | POA: Diagnosis not present

## 2018-12-31 ENCOUNTER — Other Ambulatory Visit: Payer: Self-pay | Admitting: Cardiology

## 2019-01-01 ENCOUNTER — Other Ambulatory Visit: Payer: Self-pay | Admitting: Sports Medicine

## 2019-01-01 DIAGNOSIS — M797 Fibromyalgia: Secondary | ICD-10-CM

## 2019-01-04 DIAGNOSIS — H538 Other visual disturbances: Secondary | ICD-10-CM | POA: Diagnosis not present

## 2019-01-09 ENCOUNTER — Encounter: Payer: Self-pay | Admitting: Sports Medicine

## 2019-01-09 ENCOUNTER — Ambulatory Visit (INDEPENDENT_AMBULATORY_CARE_PROVIDER_SITE_OTHER): Payer: BC Managed Care – PPO | Admitting: Sports Medicine

## 2019-01-09 ENCOUNTER — Ambulatory Visit (INDEPENDENT_AMBULATORY_CARE_PROVIDER_SITE_OTHER): Payer: BC Managed Care – PPO

## 2019-01-09 ENCOUNTER — Other Ambulatory Visit: Payer: Self-pay

## 2019-01-09 DIAGNOSIS — S42212D Unspecified displaced fracture of surgical neck of left humerus, subsequent encounter for fracture with routine healing: Secondary | ICD-10-CM | POA: Diagnosis not present

## 2019-01-09 DIAGNOSIS — S43005S Unspecified dislocation of left shoulder joint, sequela: Secondary | ICD-10-CM | POA: Diagnosis not present

## 2019-01-09 NOTE — Assessment & Plan Note (Signed)
Treated for shoulder dislocation in [place] back in November 2019. Persistent pain, never did physical therapy. Pain is predominantly referrable to the glenohumeral joint today. I was able to see a Hill-Sachs lesion on ultrasound. Glenohumeral injection today, referral for formal physical therapy, they desire [facility]. X-rays today. Return to see me in 6 weeks.

## 2019-01-09 NOTE — Progress Notes (Signed)
Subjective:    CC: Left shoulder pain  HPI: Alejandra Valdez is a pleasant 63 year old female, late last year she was in [place], ended up falling and dislocating her left shoulder.  She really never had aggressive treatment, never had physical therapy, no advanced imaging.  She is here with persistence of pain in her left shoulder, lack of abduction, external rotation and a sense of instability.    I reviewed the past medical history, family history, social history, surgical history, and allergies today and no changes were needed.  Please see the problem list section below in epic for further details.  Past Medical History: Past Medical History:  Diagnosis Date  . ADHD (attention deficit hyperactivity disorder)   . Anxiety   . Arthritis    Of spine DDD/DJD  . CAD (coronary artery disease)    a. s/p CABG in 2013 with LIMA-LAD, SVG-RCA, and SVG-OM1-D2  . Cancer [facility]) 2012   Breast  . Depression   . Diabetes mellitus without complication ([place])   . H/O bilateral breast implants 01/24/2016  . History of colon polyps   . Hypothyroid   . Lung nodule    Stable per CT 05/2013  . Migraine   . Myocardial infarction ([place]) 2013  . Neuropathy   . Osteoporosis    Past Surgical History: Past Surgical History:  Procedure Laterality Date  . ABDOMINAL HYSTERECTOMY     has left ovary  . APPENDECTOMY    . BILATERAL CARPAL TUNNEL RELEASE    . BLEPHAROPLASTY Bilateral   . CATARACT EXTRACTION Bilateral   . CESAREAN SECTION     x2  . CORONARY ARTERY BYPASS GRAFT  2013   x4  . MASTECTOMY Bilateral 2012  . OTHER SURGICAL HISTORY     Retinal repair  . SHOULDER SURGERY Bilateral    Social History: Social History   Socioeconomic History  . Marital status: Divorced    Spouse name: Not on file  . Number of children: 1  . Years of education: Not on file  . Highest education level: Not on file  Occupational History  . Occupation: Disabled  Social Needs  . Financial resource strain: Not on file  .  Food insecurity    Worry: Not on file    Inability: Not on file  . Transportation needs    Medical: Not on file    Non-medical: Not on file  Tobacco Use  . Smoking status: Never Smoker  . Smokeless tobacco: Never Used  Substance and Sexual Activity  . Alcohol use: No    Alcohol/week: 0.0 standard drinks  . Drug use: No  . Sexual activity: Never    Partners: Male  Lifestyle  . Physical activity    Days per week: Not on file    Minutes per session: Not on file  . Stress: Not on file  Relationships  . Social Herbalist on phone: Not on file    Gets together: Not on file    Attends religious service: Not on file    Active member of club or organization: Not on file    Attends meetings of clubs or organizations: Not on file    Relationship status: Not on file  Other Topics Concern  . Not on file  Social History Narrative   No daily caffeine intake. Walks for 30 minutes for exercise. She is currently disabled.   Family History: Family History  Problem Relation Age of Onset  . Depression Mother   . Alcohol  abuse Father   . Hyperlipidemia Father   . Hypertension Father   . Stroke Father   . Alcohol abuse Brother   . Depression Brother   . Hyperlipidemia Brother   . Hypertension Brother   . Cancer Maternal Grandmother   . Heart attack Maternal Grandmother   . Diabetes Maternal Grandfather   . Alcohol abuse Brother   . Depression Brother   . Hyperlipidemia Brother   . Hypertension Brother    Allergies: Allergies  Allergen Reactions  . Hydrocodone-Acetaminophen     HYDROCODONE-ACETAMINOPHEN: Constipation - Converted from Centricity  . Vicodin [Hydrocodone-Acetaminophen]    Medications: See med rec.  Review of Systems: No fevers, chills, night sweats, weight loss, chest pain, or shortness of breath.   Objective:    General: Well Developed, well nourished, and in no acute distress.  Neuro: Alert and oriented x3, extra-ocular muscles intact, sensation  grossly intact.  HEENT: Normocephalic, atraumatic, pupils equal round reactive to light, neck supple, no masses, no lymphadenopathy, thyroid nonpalpable.  Skin: Warm and dry, no rashes. Cardiac: Regular rate and rhythm, no murmurs rubs or gallops, no lower extremity edema.  Respiratory: Clear to auscultation bilaterally. Not using accessory muscles, speaking in full sentences. Left shoulder: External rotation to about 45 degrees, abduction to 50 degrees, positive impingement signs, 2+ anterior translational instability with mechanical symptoms, positive O'Brien's test, positive apprehension sign.  Procedure: Real-time Ultrasound Guided injection of the left glenohumeral joint  device: GE Logiq E  Verbal informed consent obtained.  Time-out conducted.  Noted no overlying erythema, induration, or other signs of local infection.  Skin prepped in a sterile fashion.  Local anesthesia: Topical Ethyl chloride.  With sterile technique and under real time ultrasound guidance:  Noted Hill-Sachs lesion on the posterior humeral head, 22-gauge spinal needle advanced into the joint, I injected 1 cc Kenalog 40, 2 cc lidocaine, 2 cc bupivacaine. Completed without difficulty  Pain immediately resolved suggesting accurate placement of the medication.  Advised to call if fevers/chills, erythema, induration, drainage, or persistent bleeding.  Images permanently stored and available for review in the ultrasound unit.  Impression: Technically successful ultrasound guided injection.  Impression and Recommendations:    Dislocation, shoulder, left, sequela Treated for shoulder dislocation in [place] back in November 2019. Persistent pain, never did physical therapy. Pain is predominantly referrable to the glenohumeral joint today. I was able to see a Hill-Sachs lesion on ultrasound. Glenohumeral injection today, referral for formal physical therapy, they desire this to be done in [place] at a [facility]. X-rays today. Return to see me in 6 weeks.   ___________________________________________ Gwen Her. Dianah Field, M.D., ABFM., CAQSM. Primary Care and Sports Medicine [facility]  Adjunct Professor of [employer]

## 2019-01-11 DIAGNOSIS — N952 Postmenopausal atrophic vaginitis: Secondary | ICD-10-CM | POA: Diagnosis not present

## 2019-01-11 DIAGNOSIS — Z853 Personal history of malignant neoplasm of breast: Secondary | ICD-10-CM | POA: Diagnosis not present

## 2019-01-11 DIAGNOSIS — I10 Essential (primary) hypertension: Secondary | ICD-10-CM | POA: Diagnosis not present

## 2019-01-12 DIAGNOSIS — F3340 Major depressive disorder, recurrent, in remission, unspecified: Secondary | ICD-10-CM | POA: Diagnosis not present

## 2019-01-13 DIAGNOSIS — R4182 Altered mental status, unspecified: Secondary | ICD-10-CM | POA: Diagnosis not present

## 2019-01-13 DIAGNOSIS — G3184 Mild cognitive impairment, so stated: Secondary | ICD-10-CM | POA: Diagnosis not present

## 2019-01-13 DIAGNOSIS — E1042 Type 1 diabetes mellitus with diabetic polyneuropathy: Secondary | ICD-10-CM | POA: Diagnosis not present

## 2019-01-13 DIAGNOSIS — G43719 Chronic migraine without aura, intractable, without status migrainosus: Secondary | ICD-10-CM | POA: Diagnosis not present

## 2019-01-20 ENCOUNTER — Ambulatory Visit: Payer: BC Managed Care – PPO | Admitting: Cardiology

## 2019-01-20 ENCOUNTER — Other Ambulatory Visit: Payer: Self-pay

## 2019-01-20 ENCOUNTER — Encounter: Payer: Self-pay | Admitting: Cardiology

## 2019-01-20 VITALS — Ht 66.0 in | Wt 143.0 lb

## 2019-01-20 DIAGNOSIS — I25118 Atherosclerotic heart disease of native coronary artery with other forms of angina pectoris: Secondary | ICD-10-CM

## 2019-01-20 DIAGNOSIS — E1043 Type 1 diabetes mellitus with diabetic autonomic (poly)neuropathy: Secondary | ICD-10-CM | POA: Diagnosis not present

## 2019-01-20 DIAGNOSIS — Z794 Long term (current) use of insulin: Secondary | ICD-10-CM | POA: Diagnosis not present

## 2019-01-20 DIAGNOSIS — E109 Type 1 diabetes mellitus without complications: Secondary | ICD-10-CM | POA: Diagnosis not present

## 2019-01-20 DIAGNOSIS — E10649 Type 1 diabetes mellitus with hypoglycemia without coma: Secondary | ICD-10-CM | POA: Diagnosis not present

## 2019-01-20 DIAGNOSIS — Z951 Presence of aortocoronary bypass graft: Secondary | ICD-10-CM

## 2019-01-20 DIAGNOSIS — E1065 Type 1 diabetes mellitus with hyperglycemia: Secondary | ICD-10-CM | POA: Diagnosis not present

## 2019-01-20 DIAGNOSIS — E103513 Type 1 diabetes mellitus with proliferative diabetic retinopathy with macular edema, bilateral: Secondary | ICD-10-CM | POA: Diagnosis not present

## 2019-01-20 DIAGNOSIS — I209 Angina pectoris, unspecified: Secondary | ICD-10-CM

## 2019-01-20 NOTE — Progress Notes (Signed)
Virtual Visit via Telephone Note: Patient unable to use video assisted device.  This visit type was conducted due to national recommendations for restrictions regarding the COVID-19 Pandemic (e.g. social distancing).  This format is felt to be most appropriate for this patient at this time.  All issues noted in this document were discussed and addressed.  No physical exam was performed.  The patient has consented to conduct a Telehealth visit and understands insurance will be billed.   I connected with@, on 01/20/19 at  by TELEPHONE and verified that I am speaking with the correct person using two identifiers.   I discussed the limitations of evaluation and management by telemedicine and the availability of in person appointments. The patient expressed understanding and agreed to proceed.   I have discussed with patient regarding the safety during COVID Pandemic and steps and precautions to be taken including social distancing, frequent hand wash and use of detergent soap, gels with the patient. I asked the patient to avoid touching mouth, nose, eyes, ears with the hands. I encouraged regular walking around the neighborhood and exercise and regular diet, as long as social distancing can be maintained.   Subjective:  Primary Physician/Referring:  Alejandra Marry, MD  Patient ID: Alejandra Valdez, female    DOB: [birth year], 63 y.o.   MRN: [id number]  Chief Complaint  Patient presents with  . Coronary Artery Disease    6 month f/u    HPI: Alejandra Valdez  is a 62 y.o. female  with orthostatic hypotension with symptomatic improvement with fludrocortisone, coronary artery disease with CABG 4 in 2013 in [place] when she suffered MI in 2013 S/P CABG 2013 with LIMA to LAD, SVG to RCA and SVG to OM1 and D2. She has type 1 diabetes, hypothyroidism, hyperlipidemia, depression, anxiety, history of breast cancer with bilateral mastectomy, osteoporosis and chronic back pain.  This is a 20-month office  visit, on her last office visit she had complained of slight worsening angina hence I set this up to see me back in 3 months.  States that she is doing well and has 2 episodes of angina for which he took nitroglycerin with immediate relief of chest pain.  There is no lifestyle limitation.  Denies dyspnea, dizziness or syncope.  Due to COVID-19 I was unable to schedule our office visit, patient also is in [place] presently and is on the verge of moving back to [place].  Past Medical History:  Diagnosis Date  . ADHD (attention deficit hyperactivity disorder)   . Anxiety   . Arthritis    Of spine DDD/DJD  . CAD (coronary artery disease)    a. s/p CABG in 2013 with LIMA-LAD, SVG-RCA, and SVG-OM1-D2  . Cancer [facility]) 2012   Breast  . Depression   . Diabetes mellitus without complication ([place])   . H/O bilateral breast implants 01/24/2016  . History of colon polyps   . Hypothyroid   . Lung nodule    Stable per CT 05/2013  . Migraine   . Myocardial infarction ([place]) 2013  . Neuropathy   . Osteoporosis     Past Surgical History:  Procedure Laterality Date  . ABDOMINAL HYSTERECTOMY     has left ovary  . APPENDECTOMY    . BILATERAL CARPAL TUNNEL RELEASE    . BLEPHAROPLASTY Bilateral   . CATARACT EXTRACTION Bilateral   . CESAREAN SECTION     x2  . CORONARY ARTERY BYPASS GRAFT  2013   x4  . MASTECTOMY  Bilateral 2012  . OTHER SURGICAL HISTORY     Retinal repair  . SHOULDER SURGERY Bilateral     Social History   Socioeconomic History  . Marital status: Divorced    Spouse name: Not on file  . Number of children: 1  . Years of education: Not on file  . Highest education level: Not on file  Occupational History  . Occupation: Disabled  Social Needs  . Financial resource strain: Not on file  . Food insecurity    Worry: Not on file    Inability: Not on file  . Transportation needs    Medical: Not on file    Non-medical: Not on file  Tobacco Use  . Smoking status: Never  Smoker  . Smokeless tobacco: Never Used  Substance and Sexual Activity  . Alcohol use: No    Alcohol/week: 0.0 standard drinks  . Drug use: No  . Sexual activity: Never    Partners: Male  Lifestyle  . Physical activity    Days per week: Not on file    Minutes per session: Not on file  . Stress: Not on file  Relationships  . Social Herbalist on phone: Not on file    Gets together: Not on file    Attends religious service: Not on file    Active member of club or organization: Not on file    Attends meetings of clubs or organizations: Not on file    Relationship status: Not on file  . Intimate partner violence    Fear of current or ex partner: Not on file    Emotionally abused: Not on file    Physically abused: Not on file    Forced sexual activity: Not on file  Other Topics Concern  . Not on file  Social History Narrative   No daily caffeine intake. Walks for 30 minutes for exercise. She is currently disabled.    Current Outpatient Medications on File Prior to Visit  Medication Sig Dispense Refill  . alendronate (FOSAMAX) 70 MG tablet Take 1 tablet (70 mg total) by mouth every 7 (seven) days. Take with a full glass of water on an empty stomach. 12 tablet 3  . aspirin EC 81 MG tablet Take 1 tablet (81 mg total) by mouth daily. 90 tablet 3  . BAYER CONTOUR NEXT TEST test strip 150 each as needed.  3  . Botulinum Toxin Type A 200 units SOLR Inject as directed. Once every 3 months for migraines    . celecoxib (CELEBREX) 200 MG capsule Take 1 capsule (200 mg total) by mouth daily. *NO ADDITIONAL REFILLS. PLEASE CALL THE OFFICE TO SCHEDULE AN APPOINTMENT* 30 capsule 0  . cholecalciferol (VITAMIN D3) 25 MCG (1000 UT) tablet Take 1,000 Units by mouth daily.    . clonazePAM (KLONOPIN) 1 MG tablet Take 1 mg by mouth 2 (two) times daily.    . cycloSPORINE (RESTASIS) 0.05 % ophthalmic emulsion Place 1 drop into both eyes 2 (two) times daily. 1 each 1  . DULoxetine (CYMBALTA) 30  MG capsule TAKE 1 CAPSULE(30 MG) BY MOUTH DAILY 30 capsule 0  . ezetimibe (ZETIA) 10 MG tablet TAKE 1 TABLET(10 MG) BY MOUTH DAILY 30 tablet 0  . fludrocortisone (FLORINEF) 0.1 MG tablet TAKE 1 TABLET BY MOUTH IN THE EVENING AFTER DINNER 30 tablet 3  . ibandronate (BONIVA) 150 MG tablet TAKE 1 TABLET BY MOUTH EVERY 30 DAYS 12 tablet 0  . insulin aspart (NOVOLOG) 100 UNIT/ML injection  Use up to 75 units daily by insulin pump. 30 mL 3  . lamoTRIgine (LAMICTAL) 100 MG tablet Take 1 tablet (100 mg total) daily by mouth. 30 tablet 1  . levothyroxine (SYNTHROID) 125 MCG tablet Take 1 tablet (125 mcg total) by mouth 2 (two) times a week. Then take the 165mcg the other 5 days of the week. 16 tablet 0  . levothyroxine (SYNTHROID, LEVOTHROID) 137 MCG tablet Take 1 tablet by mouth daily. By itself on an empty stomach    . meloxicam (MOBIC) 15 MG tablet TAKE 1 TABLET BY MOUTH EVERY MORNING WITH BREAKFAST FOR 2 WEEKS AS NEEDED FOR PAIN 30 tablet 0  . metoprolol succinate (TOPROL-XL) 25 MG 24 hr tablet TAKE 1 TABLET BY MOUTH DAILY 30 tablet 0  . nitroGLYCERIN (NITROSTAT) 0.4 MG SL tablet PLACE 1 TABLET UNDER THE TONGUE EVERY 5 MINUTES AS NEEDED FOR CHEST PAIN 25 tablet 1  . omeprazole (PRILOSEC) 40 MG capsule TAKE ONE CAPSULE BY MOUTH EVERY DAY 90 capsule 3  . simvastatin (ZOCOR) 40 MG tablet TAKE 1 TABLET BY MOUTH DAILY AT 6 PM**SCHEDULE APPT FOR FUTURE REFILLS** 30 tablet 0  . traZODone (DESYREL) 100 MG tablet Take 2 tablets (200 mg total) by mouth at bedtime. 180 tablet 1  . Acetone, Urine, Test (KETONE TEST) STRP by Does not apply route.    . naratriptan (AMERGE) 2.5 MG tablet Take 2.5 mg by mouth. Take one tablet at onset of migraine do not take more than 1 a day  0  . nortriptyline (PAMELOR) 10 MG capsule TAKE 1 CAPSULE(10 MG) BY MOUTH THREE TIMES DAILY. FOLLOW UP APPOINTMENT BEFORE MORE REFILLS (Patient taking differently: TAKE 1 CAPSULE(10 MG) BY MOUTH at bedtime) 30 capsule 0  . [DISCONTINUED] temazepam  (RESTORIL) 15 MG capsule TK ONE C PO QHS  1   No current facility-administered medications on file prior to visit.    Review of Systems  Constitution: Negative for chills, decreased appetite, malaise/fatigue and weight gain.  Cardiovascular: Positive for chest pain (Has used occasional NTG) and palpitations (occasional). Negative for dyspnea on exertion, leg swelling and syncope.  Endocrine: Negative for cold intolerance.  Hematologic/Lymphatic: Does not bruise/bleed easily.  Musculoskeletal: Negative for joint pain and joint swelling.  Gastrointestinal: Negative for abdominal pain, anorexia and change in bowel habit.  Neurological: Negative for headaches and light-headedness.  Psychiatric/Behavioral: Negative for depression and substance abuse.  All other systems reviewed and are negative.  Objective:  Height 5\' 6"  (1.676 m), weight 143 lb (64.9 kg). Body mass index is 23.08 kg/m.  Physical exam not performed or limited due to virtual visit.  Radiology: No results found.   Laboratory Examination:  CMP Latest Ref Rng & Units 04/15/2018 07/30/2017 05/28/2016  Glucose 65 - 99 mg/dL - 150(H) 80  BUN 6 - 20 mg/dL - 25(H) 12  Creatinine 0.44 - 1.00 mg/dL - 0.85 0.72  Sodium 135 - 145 mmol/L - 138 142  Potassium 3.5 - 5.1 mmol/L - 3.6 4.3  Chloride 101 - 111 mmol/L - 107 109  CO2 22 - 32 mmol/L - 25 21  Calcium 8.9 - 10.3 mg/dL - 8.7(L) 9.0  Total Protein 6.1 - 8.1 g/dL - - 6.0(L)  Total Bilirubin 0.2 - 1.2 mg/dL - - 0.5  Alkaline Phos - 72 - 52  AST - 30 - 25  ALT - 35 - 17   CBC Latest Ref Rng & Units 04/12/2018 04/11/2015  WBC - 3.2 4.3  Hemoglobin 12.0 - 16.0 13.3  14.6  Hematocrit 36 - 46 39 44.4  Platelets 150 - 400 K/uL - 253   Lipid Panel     Component Value Date/Time   CHOL 123 05/28/2016 1527   TRIG 79 05/28/2016 1527   HDL 53 05/28/2016 1527   CHOLHDL 2.3 05/28/2016 1527   VLDL 16 05/28/2016 1527   LDLCALC 54 05/28/2016 1527   HEMOGLOBIN A1C Lab Results   Component Value Date   HGBA1C 7.3 (H) 11/10/2018   MPG 163 11/10/2018   TSH Recent Labs    04/12/18 04/15/18 08/08/18 1556  TSH 5.08 4.86 7.17*   Cardiac studies:   Lexiscan Myoview stress test 05/11/2016:  Normal EF, no evidence of ischemia, low risk study  Echocardiogram 11/30/2016:  Normal LV systolic function, EF 58-83%.  No wall motion of mortality.  Pseudo-normal LV diastolic function.  No significant valvular abnormality.  Assessment:      ICD-10-CM   1. Angina pectoris (HCC)  I20.9   2. Coronary artery disease of native artery of native heart with stable angina pectoris ([place])  I25.118   3. Hx of CABG  2013: LIMA to LAD, SVG to RCA and SVG to OM1 and D2.  Z95.1     EKG 07/18/2018: Normal sinus rhythm at rate of 87 bpm, left atrial enlargement, incomplete right bundle branch block.  No evidence of ischemia otherwise normal EKG. No significant change from EKG 04/11/2018: Normal sinus rhythm at rate of 80 bpm, normal axis. Recommendations:    She is presently doing well and has class II stable angina pectoris.  No clinical evidence to suggest congestive heart failure by history.   Cholesterol is well controlled, presently on appropriate statin therapy.  She has not had recent lipid profile testing since September 2019, advised her that I would like to see her in person instead of virtual visit with EKG, and I could certainly repeat her labs at that time if not done.  Recently diabetes has been slightly uncontrolled with A1c around 7.2% in February 2020.  She is following very strict ADA guidelines.  No changes in the medications were done today.  Adrian Prows, MD, [facility] 01/20/2019, 4:13 PM [facility] [place] Pager: [phone] Office: [phone] If no answer Cell [phone]

## 2019-01-23 DIAGNOSIS — M2041 Other hammer toe(s) (acquired), right foot: Secondary | ICD-10-CM | POA: Diagnosis not present

## 2019-01-23 DIAGNOSIS — B351 Tinea unguium: Secondary | ICD-10-CM | POA: Diagnosis not present

## 2019-01-23 DIAGNOSIS — L600 Ingrowing nail: Secondary | ICD-10-CM | POA: Diagnosis not present

## 2019-01-23 DIAGNOSIS — E119 Type 2 diabetes mellitus without complications: Secondary | ICD-10-CM | POA: Diagnosis not present

## 2019-01-23 DIAGNOSIS — M2042 Other hammer toe(s) (acquired), left foot: Secondary | ICD-10-CM | POA: Diagnosis not present

## 2019-02-10 ENCOUNTER — Other Ambulatory Visit: Payer: Self-pay | Admitting: Sports Medicine

## 2019-02-10 DIAGNOSIS — M797 Fibromyalgia: Secondary | ICD-10-CM

## 2019-02-16 DIAGNOSIS — F3340 Major depressive disorder, recurrent, in remission, unspecified: Secondary | ICD-10-CM | POA: Diagnosis not present

## 2019-02-20 ENCOUNTER — Ambulatory Visit: Payer: BC Managed Care – PPO | Admitting: Sports Medicine

## 2019-02-22 DIAGNOSIS — F3340 Major depressive disorder, recurrent, in remission, unspecified: Secondary | ICD-10-CM | POA: Diagnosis not present

## 2019-03-07 DIAGNOSIS — M545 Low back pain: Secondary | ICD-10-CM | POA: Diagnosis not present

## 2019-03-07 DIAGNOSIS — W19XXXA Unspecified fall, initial encounter: Secondary | ICD-10-CM | POA: Diagnosis not present

## 2019-03-07 DIAGNOSIS — S61402A Unspecified open wound of left hand, initial encounter: Secondary | ICD-10-CM | POA: Diagnosis not present

## 2019-03-07 DIAGNOSIS — M533 Sacrococcygeal disorders, not elsewhere classified: Secondary | ICD-10-CM | POA: Diagnosis not present

## 2019-03-07 DIAGNOSIS — S0083XA Contusion of other part of head, initial encounter: Secondary | ICD-10-CM | POA: Diagnosis not present

## 2019-03-07 DIAGNOSIS — S3992XA Unspecified injury of lower back, initial encounter: Secondary | ICD-10-CM | POA: Diagnosis not present

## 2019-03-10 ENCOUNTER — Ambulatory Visit (INDEPENDENT_AMBULATORY_CARE_PROVIDER_SITE_OTHER): Payer: BC Managed Care – PPO

## 2019-03-10 ENCOUNTER — Other Ambulatory Visit: Payer: Self-pay

## 2019-03-10 ENCOUNTER — Ambulatory Visit (INDEPENDENT_AMBULATORY_CARE_PROVIDER_SITE_OTHER): Payer: BC Managed Care – PPO | Admitting: Sports Medicine

## 2019-03-10 DIAGNOSIS — M545 Low back pain: Secondary | ICD-10-CM | POA: Diagnosis not present

## 2019-03-10 DIAGNOSIS — S79911A Unspecified injury of right hip, initial encounter: Secondary | ICD-10-CM | POA: Diagnosis not present

## 2019-03-10 DIAGNOSIS — W19XXXA Unspecified fall, initial encounter: Secondary | ICD-10-CM

## 2019-03-10 DIAGNOSIS — M25551 Pain in right hip: Secondary | ICD-10-CM | POA: Diagnosis not present

## 2019-03-10 DIAGNOSIS — M25552 Pain in left hip: Secondary | ICD-10-CM

## 2019-03-10 DIAGNOSIS — S0083XA Contusion of other part of head, initial encounter: Secondary | ICD-10-CM | POA: Diagnosis not present

## 2019-03-10 DIAGNOSIS — M533 Sacrococcygeal disorders, not elsewhere classified: Secondary | ICD-10-CM

## 2019-03-10 DIAGNOSIS — S79912A Unspecified injury of left hip, initial encounter: Secondary | ICD-10-CM | POA: Diagnosis not present

## 2019-03-10 MED ORDER — TRAMADOL HCL 50 MG PO TABS: 50.0000 mg | ORAL_TABLET | Freq: Three times a day (TID) | ORAL | 0 refills | Status: DC | PRN

## 2019-03-10 NOTE — Assessment & Plan Note (Signed)
Recent fall, directly on her butt, then the ladder fell on her face. She has tenderness in the lower lumbar spine along the spinous processes, we do need x-rays of her lumbar spine and pelvis looking for a vertebral compression fracture. I would also like a CT of her maxillofacial bones as she is very tender around her superior and inferior orbit on the left. Tramadol for pain, return to see me in 2 weeks.

## 2019-03-10 NOTE — Progress Notes (Signed)
Subjective:    CC: Fall  HPI: 3 days ago this pleasant 63 year old female fell backwards off of a ladder onto her bottom, the latter then fell on her face.  She had immediate pain in her low back, as well as immediate bruising, swelling over her left orbit.  She did have a bit of dizziness.  Unfortunately continues to have severe pain in the low back, midline with radiation into the left leg, as well as pain, bruising and periorbital hematoma on the left.  Severe, persistent, localized without radiation.  I reviewed the past medical history, family history, social history, surgical history, and allergies today and no changes were needed.  Please see the problem list section below in epic for further details.  Past Medical History: Past Medical History:  Diagnosis Date  . ADHD (attention deficit hyperactivity disorder)   . Anxiety   . Arthritis    Of spine DDD/DJD  . CAD (coronary artery disease)    a. s/p CABG in 2013 with LIMA-LAD, SVG-RCA, and SVG-OM1-D2  . Cancer [facility]) 2012   Breast  . Depression   . Diabetes mellitus without complication ([place])   . H/O bilateral breast implants 01/24/2016  . History of colon polyps   . Hypothyroid   . Lung nodule    Stable per CT 05/2013  . Migraine   . Myocardial infarction ([place]) 2013  . Neuropathy   . Osteoporosis    Past Surgical History: Past Surgical History:  Procedure Laterality Date  . ABDOMINAL HYSTERECTOMY     has left ovary  . APPENDECTOMY    . BILATERAL CARPAL TUNNEL RELEASE    . BLEPHAROPLASTY Bilateral   . CATARACT EXTRACTION Bilateral   . CESAREAN SECTION     x2  . CORONARY ARTERY BYPASS GRAFT  2013   x4  . MASTECTOMY Bilateral 2012  . OTHER SURGICAL HISTORY     Retinal repair  . SHOULDER SURGERY Bilateral    Social History: Social History   Socioeconomic History  . Marital status: Divorced    Spouse name: Not on file  . Number of children: 1  . Years of education: Not on file  . Highest education level:  Not on file  Occupational History  . Occupation: Disabled  Social Needs  . Financial resource strain: Not on file  . Food insecurity    Worry: Not on file    Inability: Not on file  . Transportation needs    Medical: Not on file    Non-medical: Not on file  Tobacco Use  . Smoking status: Never Smoker  . Smokeless tobacco: Never Used  Substance and Sexual Activity  . Alcohol use: No    Alcohol/week: 0.0 standard drinks  . Drug use: No  . Sexual activity: Never    Partners: Male  Lifestyle  . Physical activity    Days per week: Not on file    Minutes per session: Not on file  . Stress: Not on file  Relationships  . Social Herbalist on phone: Not on file    Gets together: Not on file    Attends religious service: Not on file    Active member of club or organization: Not on file    Attends meetings of clubs or organizations: Not on file    Relationship status: Not on file  Other Topics Concern  . Not on file  Social History Narrative   No daily caffeine intake. Walks for 30 minutes for exercise. She  is currently disabled.   Family History: Family History  Problem Relation Age of Onset  . Depression Mother   . Alcohol abuse Father   . Hyperlipidemia Father   . Hypertension Father   . Stroke Father   . Alcohol abuse Brother   . Depression Brother   . Hyperlipidemia Brother   . Hypertension Brother   . Cancer Maternal Grandmother   . Heart attack Maternal Grandmother   . Diabetes Maternal Grandfather   . Alcohol abuse Brother   . Depression Brother   . Hyperlipidemia Brother   . Hypertension Brother    Allergies: Allergies  Allergen Reactions  . Hydrocodone-Acetaminophen     HYDROCODONE-ACETAMINOPHEN: Constipation - Converted from Centricity  . Vicodin [Hydrocodone-Acetaminophen]    Medications: See med rec.  Review of Systems: No fevers, chills, night sweats, weight loss, chest pain, or shortness of breath.   Objective:    General: Well  Developed, well nourished, and in no acute distress.  Neuro: Alert and oriented x3, extra-ocular muscles intact, sensation grossly intact.  HEENT: Normocephalic, left orbital hematoma, tenderness around the superior and inferior orbital rim, pupils equal round reactive to light, neck supple, no masses, no lymphadenopathy, thyroid nonpalpable.  Skin: Warm and dry, no rashes. Cardiac: Regular rate and rhythm, no murmurs rubs or gallops, no lower extremity edema.  Respiratory: Clear to auscultation bilaterally. Not using accessory muscles, speaking in full sentences. Low back: Tender to palpation in the lower lumbar spinous processes, as well as over the sacrum bilaterally.  Impression and Recommendations:    Fall Recent fall, directly on her butt, then the ladder fell on her face. She has tenderness in the lower lumbar spine along the spinous processes, we do need x-rays of her lumbar spine and pelvis looking for a vertebral compression fracture. I would also like a CT of her maxillofacial bones as she is very tender around her superior and inferior orbit on the left. Tramadol for pain, return to see me in 2 weeks.   ___________________________________________ Gwen Her. Dianah Field, M.D., ABFM., CAQSM. Primary Care and Sports Medicine [facility]  Adjunct Professor of [place] [employer] [facility] [employer]

## 2019-03-20 DIAGNOSIS — D3131 Benign neoplasm of right choroid: Secondary | ICD-10-CM | POA: Diagnosis not present

## 2019-03-20 DIAGNOSIS — E103593 Type 1 diabetes mellitus with proliferative diabetic retinopathy without macular edema, bilateral: Secondary | ICD-10-CM | POA: Diagnosis not present

## 2019-03-21 DIAGNOSIS — M79672 Pain in left foot: Secondary | ICD-10-CM | POA: Diagnosis not present

## 2019-03-21 DIAGNOSIS — M19072 Primary osteoarthritis, left ankle and foot: Secondary | ICD-10-CM | POA: Diagnosis not present

## 2019-03-21 DIAGNOSIS — M2041 Other hammer toe(s) (acquired), right foot: Secondary | ICD-10-CM | POA: Diagnosis not present

## 2019-03-21 DIAGNOSIS — M2042 Other hammer toe(s) (acquired), left foot: Secondary | ICD-10-CM | POA: Diagnosis not present

## 2019-03-29 ENCOUNTER — Other Ambulatory Visit: Payer: Self-pay

## 2019-03-29 ENCOUNTER — Ambulatory Visit: Payer: BC Managed Care – PPO | Admitting: Sports Medicine

## 2019-03-29 MED ORDER — NITROGLYCERIN 0.4 MG SL SUBL: SUBLINGUAL_TABLET | SUBLINGUAL | 1 refills | Status: DC

## 2019-03-31 ENCOUNTER — Other Ambulatory Visit: Payer: Self-pay | Admitting: Sports Medicine

## 2019-03-31 DIAGNOSIS — M797 Fibromyalgia: Secondary | ICD-10-CM

## 2019-04-01 ENCOUNTER — Other Ambulatory Visit: Payer: Self-pay | Admitting: Family Medicine

## 2019-04-03 ENCOUNTER — Other Ambulatory Visit: Payer: Self-pay

## 2019-04-03 NOTE — Telephone Encounter (Signed)
Patient now seen by Dr Jackie Plum. Rx refused.

## 2019-04-19 DIAGNOSIS — E1069 Type 1 diabetes mellitus with other specified complication: Secondary | ICD-10-CM | POA: Diagnosis not present

## 2019-04-19 DIAGNOSIS — E103599 Type 1 diabetes mellitus with proliferative diabetic retinopathy without macular edema, unspecified eye: Secondary | ICD-10-CM | POA: Diagnosis not present

## 2019-04-19 DIAGNOSIS — E039 Hypothyroidism, unspecified: Secondary | ICD-10-CM | POA: Diagnosis not present

## 2019-04-19 DIAGNOSIS — E1065 Type 1 diabetes mellitus with hyperglycemia: Secondary | ICD-10-CM | POA: Diagnosis not present

## 2019-04-19 DIAGNOSIS — I10 Essential (primary) hypertension: Secondary | ICD-10-CM | POA: Diagnosis not present

## 2019-04-20 DIAGNOSIS — Z794 Long term (current) use of insulin: Secondary | ICD-10-CM | POA: Diagnosis not present

## 2019-04-20 DIAGNOSIS — E109 Type 1 diabetes mellitus without complications: Secondary | ICD-10-CM | POA: Diagnosis not present

## 2019-04-21 DIAGNOSIS — F3340 Major depressive disorder, recurrent, in remission, unspecified: Secondary | ICD-10-CM | POA: Diagnosis not present

## 2019-04-24 ENCOUNTER — Other Ambulatory Visit: Payer: Self-pay

## 2019-04-24 ENCOUNTER — Encounter: Payer: Self-pay | Admitting: Cardiology

## 2019-04-24 ENCOUNTER — Telehealth: Payer: BC Managed Care – PPO | Admitting: Cardiology

## 2019-04-24 VITALS — Ht 66.0 in | Wt 145.0 lb

## 2019-04-24 DIAGNOSIS — I209 Angina pectoris, unspecified: Secondary | ICD-10-CM

## 2019-04-24 DIAGNOSIS — Z951 Presence of aortocoronary bypass graft: Secondary | ICD-10-CM | POA: Diagnosis not present

## 2019-04-24 DIAGNOSIS — I25118 Atherosclerotic heart disease of native coronary artery with other forms of angina pectoris: Secondary | ICD-10-CM | POA: Diagnosis not present

## 2019-04-24 DIAGNOSIS — F3340 Major depressive disorder, recurrent, in remission, unspecified: Secondary | ICD-10-CM | POA: Diagnosis not present

## 2019-04-24 DIAGNOSIS — E7800 Pure hypercholesterolemia, unspecified: Secondary | ICD-10-CM

## 2019-04-24 NOTE — Progress Notes (Addendum)
Virtual Visit via Telephone Note: Patient unable to use video assisted device.  This visit type was conducted due to national recommendations for restrictions regarding the COVID-19 Pandemic (e.g. social distancing).  This format is felt to be most appropriate for this patient at this time.  All issues noted in this document were discussed and addressed.  No physical exam was performed.  The patient has consented to conduct a Telehealth visit and understands insurance will be billed.   I connected with@, on 04/24/19 at  by TELEPHONE and verified that I am speaking with the correct person using two identifiers.   I discussed the limitations of evaluation and management by telemedicine and the availability of in person appointments. The patient expressed understanding and agreed to proceed.   I have discussed with patient regarding the safety during COVID Pandemic and steps and precautions to be taken including social distancing, frequent hand wash and use of detergent soap, gels with the patient. I asked the patient to avoid touching mouth, nose, eyes, ears with the hands. I encouraged regular walking around the neighborhood and exercise and regular diet, as long as social distancing can be maintained.   Subjective:  Primary Physician/Referring:  Hali Marry, MD  Patient ID: Alejandra Valdez, female    DOB: 1955-07-07, 63 y.o.   MRN: [id number]  Chief Complaint  Patient presents with  . Hypertension  . Coronary Artery Disease  . Follow-up    3 month   HPI: Alejandra Valdez  is a 63 y.o. female  with orthostatic hypotension with symptomatic improvement with fludrocortisone, coronary artery disease with CABG 4 in 2013 in [place] when she suffered MI in 2013 S/P CABG 2013 with LIMA to LAD, SVG to RCA and SVG to OM1 and D2. She has type 1 diabetes, hypothyroidism, hyperlipidemia, depression, anxiety, history of breast cancer with bilateral mastectomy, osteoporosis and chronic back pain.   This is a 43-month office visit, she had an episode of palpitations 2 weeks ago that lasted 30 minutes and HR 117/min. Associated chest pain and took a S/L NTG helped her symptoms, no further episodes.  She has resumed her activity and has not had chest pain. On her last office visit she had complained of slight worsening angina hence I set this up to see me back in 3 months.  She has no moved to [place], [place].   Past Medical History:  Diagnosis Date  . ADHD (attention deficit hyperactivity disorder)   . Anxiety   . Arthritis    Of spine DDD/DJD  . CAD (coronary artery disease)    a. s/p CABG in 2013 with LIMA-LAD, SVG-RCA, and SVG-OM1-D2  . Cancer [facility]) 2012   Breast  . Depression   . Diabetes mellitus without complication ([place])   . H/O bilateral breast implants 01/24/2016  . History of colon polyps   . Hypothyroid   . Lung nodule    Stable per CT 05/2013  . Migraine   . Myocardial infarction ([place]) 2013  . Neuropathy   . Osteoporosis     Past Surgical History:  Procedure Laterality Date  . ABDOMINAL HYSTERECTOMY     has left ovary  . APPENDECTOMY    . BILATERAL CARPAL TUNNEL RELEASE    . BLEPHAROPLASTY Bilateral   . CATARACT EXTRACTION Bilateral   . CESAREAN SECTION     x2  . CORONARY ARTERY BYPASS GRAFT  2013   x4  . MASTECTOMY Bilateral 2012  . OTHER SURGICAL HISTORY  Retinal repair  . SHOULDER SURGERY Bilateral     Social History   Socioeconomic History  . Marital status: Divorced    Spouse name: Not on file  . Number of children: 1  . Years of education: Not on file  . Highest education level: Not on file  Occupational History  . Occupation: Disabled  Social Needs  . Financial resource strain: Not on file  . Food insecurity    Worry: Not on file    Inability: Not on file  . Transportation needs    Medical: Not on file    Non-medical: Not on file  Tobacco Use  . Smoking status: Never Smoker  . Smokeless tobacco: Never Used  Substance and Sexual  Activity  . Alcohol use: No    Alcohol/week: 0.0 standard drinks  . Drug use: No  . Sexual activity: Never    Partners: Male  Lifestyle  . Physical activity    Days per week: Not on file    Minutes per session: Not on file  . Stress: Not on file  Relationships  . Social Herbalist on phone: Not on file    Gets together: Not on file    Attends religious service: Not on file    Active member of club or organization: Not on file    Attends meetings of clubs or organizations: Not on file    Relationship status: Not on file  . Intimate partner violence    Fear of current or ex partner: Not on file    Emotionally abused: Not on file    Physically abused: Not on file    Forced sexual activity: Not on file  Other Topics Concern  . Not on file  Social History Narrative   No daily caffeine intake. Walks for 30 minutes for exercise. She is currently disabled.    Current Outpatient Medications on File Prior to Visit  Medication Sig Dispense Refill  . Acetone, Urine, Test (KETONE TEST) STRP by Does not apply route.    Marland Kitchen alendronate (FOSAMAX) 70 MG tablet Take 1 tablet (70 mg total) by mouth every 7 (seven) days. Take with a full glass of water on an empty stomach. 12 tablet 3  . aspirin EC 81 MG tablet Take 1 tablet (81 mg total) by mouth daily. 90 tablet 3  . BAYER CONTOUR NEXT TEST test strip 150 each as needed.  3  . Botulinum Toxin Type A 200 units SOLR Inject as directed. Once every 3 months for migraines    . celecoxib (CELEBREX) 200 MG capsule Take 1 capsule (200 mg total) by mouth daily. *NO ADDITIONAL REFILLS. PLEASE CALL THE OFFICE TO SCHEDULE AN APPOINTMENT* 30 capsule 0  . cholecalciferol (VITAMIN D3) 25 MCG (1000 UT) tablet Take 1,000 Units by mouth daily.    . clonazePAM (KLONOPIN) 1 MG tablet Take 1 mg by mouth 2 (two) times daily.    . cycloSPORINE (RESTASIS) 0.05 % ophthalmic emulsion Place 1 drop into both eyes 2 (two) times daily. 1 each 1  . DULoxetine  (CYMBALTA) 30 MG capsule TAKE 1 CAPSULE(30 MG) BY MOUTH DAILY 30 capsule 0  . ezetimibe (ZETIA) 10 MG tablet TAKE 1 TABLET(10 MG) BY MOUTH DAILY 30 tablet 0  . fludrocortisone (FLORINEF) 0.1 MG tablet TAKE 1 TABLET BY MOUTH IN THE EVENING AFTER DINNER 30 tablet 3  . ibandronate (BONIVA) 150 MG tablet TAKE 1 TABLET BY MOUTH EVERY 30 DAYS 3 tablet 4  . insulin aspart (  NOVOLOG) 100 UNIT/ML injection Use up to 75 units daily by insulin pump. 30 mL 3  . lamoTRIgine (LAMICTAL) 100 MG tablet Take 1 tablet (100 mg total) daily by mouth. 30 tablet 1  . levothyroxine (SYNTHROID) 125 MCG tablet Take 1 tablet (125 mcg total) by mouth 2 (two) times a week. Then take the 156mcg the other 5 days of the week. 16 tablet 0  . meloxicam (MOBIC) 15 MG tablet TAKE 1 TABLET BY MOUTH EVERY MORNING WITH BREAKFAST FOR 2 WEEKS AS NEEDED FOR PAIN 30 tablet 0  . metoprolol succinate (TOPROL-XL) 25 MG 24 hr tablet TAKE 1 TABLET BY MOUTH DAILY 30 tablet 0  . naratriptan (AMERGE) 2.5 MG tablet Take 2.5 mg by mouth. Take one tablet at onset of migraine do not take more than 1 a day  0  . nitroGLYCERIN (NITROSTAT) 0.4 MG SL tablet PLACE 1 TABLET UNDER THE TONGUE EVERY 5 MINUTES AS NEEDED FOR CHEST PAIN 25 tablet 1  . nortriptyline (PAMELOR) 10 MG capsule TAKE 1 CAPSULE(10 MG) BY MOUTH THREE TIMES DAILY. FOLLOW UP APPOINTMENT BEFORE MORE REFILLS (Patient taking differently: TAKE 1 CAPSULE(10 MG) BY MOUTH at bedtime) 30 capsule 0  . omeprazole (PRILOSEC) 40 MG capsule TAKE ONE CAPSULE BY MOUTH EVERY DAY 90 capsule 3  . simvastatin (ZOCOR) 40 MG tablet TAKE 1 TABLET BY MOUTH DAILY AT 6 PM**SCHEDULE APPT FOR FUTURE REFILLS** 30 tablet 0  . traMADol (ULTRAM) 50 MG tablet Take 1 tablet (50 mg total) by mouth every 8 (eight) hours as needed for moderate pain. Maximum 6 tabs per day. 21 tablet 0  . traZODone (DESYREL) 100 MG tablet Take 2 tablets (200 mg total) by mouth at bedtime. 180 tablet 1  . zolpidem (AMBIEN CR) 6.25 MG CR tablet Take  1 tablet by mouth daily as needed.    . [DISCONTINUED] temazepam (RESTORIL) 15 MG capsule TK ONE C PO QHS  1   No current facility-administered medications on file prior to visit.    Review of Systems  Constitution: Negative for chills, decreased appetite, malaise/fatigue and weight gain.  Cardiovascular: Positive for chest pain (Has used occasional NTG) and palpitations (occasional). Negative for dyspnea on exertion, leg swelling and syncope.  Endocrine: Negative for cold intolerance.  Hematologic/Lymphatic: Does not bruise/bleed easily.  Musculoskeletal: Negative for joint pain and joint swelling.  Gastrointestinal: Negative for abdominal pain, anorexia and change in bowel habit.  Neurological: Negative for headaches and light-headedness.  Psychiatric/Behavioral: Negative for depression and substance abuse.  All other systems reviewed and are negative.  Objective:  Height 5\' 6"  (1.676 m), weight 145 lb (65.8 kg). Body mass index is 23.4 kg/m.  Physical exam not performed or limited due to virtual visit.  Radiology: No results found.   Laboratory Examination:  CMP Latest Ref Rng & Units 04/15/2018 07/30/2017 05/28/2016  Glucose 65 - 99 mg/dL - 150(H) 80  BUN 6 - 20 mg/dL - 25(H) 12  Creatinine 0.44 - 1.00 mg/dL - 0.85 0.72  Sodium 135 - 145 mmol/L - 138 142  Potassium 3.5 - 5.1 mmol/L - 3.6 4.3  Chloride 101 - 111 mmol/L - 107 109  CO2 22 - 32 mmol/L - 25 21  Calcium 8.9 - 10.3 mg/dL - 8.7(L) 9.0  Total Protein 6.1 - 8.1 g/dL - - 6.0(L)  Total Bilirubin 0.2 - 1.2 mg/dL - - 0.5  Alkaline Phos - 72 - 52  AST - 30 - 25  ALT - 35 - 17   CBC  Latest Ref Rng & Units 04/12/2018 04/11/2015  WBC - 3.2 4.3  Hemoglobin 12.0 - 16.0 13.3 14.6  Hematocrit 36 - 46 39 44.4  Platelets 150 - 400 K/uL - 253   Lipid Panel     Component Value Date/Time   CHOL 123 05/28/2016 1527   TRIG 79 05/28/2016 1527   HDL 53 05/28/2016 1527   CHOLHDL 2.3 05/28/2016 1527   VLDL 16 05/28/2016 1527    LDLCALC 54 05/28/2016 1527   HEMOGLOBIN A1C Lab Results  Component Value Date   HGBA1C 7.3 (H) 11/10/2018   MPG 163 11/10/2018   TSH Recent Labs    08/08/18 1556  TSH 7.17*   Cardiac studies:   Lexiscan Myoview stress test 05/11/2016:  Normal EF, no evidence of ischemia, low risk study  Echocardiogram 11/30/2016:  Normal LV systolic function, EF 123456.  No wall motion of mortality.  Pseudo-normal LV diastolic function.  No significant valvular abnormality.  Assessment:      ICD-10-CM   1. Angina pectoris (HCC)  I20.9   2. Coronary artery disease of native artery of native heart with stable angina pectoris ([place])  I25.118   3. Hx of CABG  2013: LIMA to LAD, SVG to RCA and SVG to OM1 and D2.  Z95.1   4. Hypercholesteremia  E78.00     EKG 07/18/2018: Normal sinus rhythm at rate of 87 bpm, left atrial enlargement, incomplete right bundle branch block.  No evidence of ischemia otherwise normal EKG. No significant change from EKG 04/11/2018: Normal sinus rhythm at rate of 80 bpm, normal axis. Recommendations:    This is a 24-month office visit, she had an episode of palpitations 2 weeks ago that lasted 30 minutes and HR 117/min. Associated chest pain and took a S/L NTG helped her symptoms, no further episodes. She is presently doing well and has class II stable angina pectoris.  Lipids are well controlled, only recently diabetes is uncontrolled.  She also has stress presently as she recently moved [facility] to [facility].  Advised her that if she continues to have frequent episodes of chest pain to contact me immediately until she establishes with someone close by.  I did not make any changes to her medications today. I did not make a return visit appointment today and will try to set her up to see a cardiologist in the [facility].   Adrian Prows, MD, [facility] 04/24/2019, 4:35 PM [facility] [place] Pager: [phone] Office: [phone] If no answer  Cell [phone]

## 2019-04-29 ENCOUNTER — Other Ambulatory Visit: Payer: Self-pay | Admitting: Sports Medicine

## 2019-04-29 DIAGNOSIS — M797 Fibromyalgia: Secondary | ICD-10-CM

## 2019-05-16 DIAGNOSIS — F3340 Major depressive disorder, recurrent, in remission, unspecified: Secondary | ICD-10-CM | POA: Diagnosis not present

## 2019-05-19 DIAGNOSIS — Z86010 Personal history of colonic polyps: Secondary | ICD-10-CM | POA: Diagnosis not present

## 2019-05-19 DIAGNOSIS — K5909 Other constipation: Secondary | ICD-10-CM | POA: Diagnosis not present

## 2019-05-19 DIAGNOSIS — E109 Type 1 diabetes mellitus without complications: Secondary | ICD-10-CM | POA: Diagnosis not present

## 2019-05-19 DIAGNOSIS — E038 Other specified hypothyroidism: Secondary | ICD-10-CM | POA: Diagnosis not present

## 2019-05-26 DIAGNOSIS — M545 Low back pain: Secondary | ICD-10-CM | POA: Diagnosis not present

## 2019-05-26 DIAGNOSIS — M542 Cervicalgia: Secondary | ICD-10-CM | POA: Diagnosis not present

## 2019-05-26 DIAGNOSIS — M5136 Other intervertebral disc degeneration, lumbar region: Secondary | ICD-10-CM | POA: Diagnosis not present

## 2019-05-29 ENCOUNTER — Other Ambulatory Visit: Payer: Self-pay | Admitting: Sports Medicine

## 2019-05-29 DIAGNOSIS — M797 Fibromyalgia: Secondary | ICD-10-CM

## 2019-06-02 DIAGNOSIS — F3340 Major depressive disorder, recurrent, in remission, unspecified: Secondary | ICD-10-CM | POA: Diagnosis not present

## 2019-06-05 DIAGNOSIS — Z794 Long term (current) use of insulin: Secondary | ICD-10-CM | POA: Diagnosis not present

## 2019-06-05 DIAGNOSIS — E109 Type 1 diabetes mellitus without complications: Secondary | ICD-10-CM | POA: Diagnosis not present

## 2019-06-07 DIAGNOSIS — D124 Benign neoplasm of descending colon: Secondary | ICD-10-CM | POA: Diagnosis not present

## 2019-06-07 DIAGNOSIS — R140 Abdominal distension (gaseous): Secondary | ICD-10-CM | POA: Diagnosis not present

## 2019-06-07 DIAGNOSIS — K5909 Other constipation: Secondary | ICD-10-CM | POA: Diagnosis not present

## 2019-06-07 DIAGNOSIS — R1013 Epigastric pain: Secondary | ICD-10-CM | POA: Diagnosis not present

## 2019-06-08 DIAGNOSIS — F3340 Major depressive disorder, recurrent, in remission, unspecified: Secondary | ICD-10-CM | POA: Diagnosis not present

## 2019-06-09 DIAGNOSIS — E038 Other specified hypothyroidism: Secondary | ICD-10-CM | POA: Diagnosis not present

## 2019-06-19 ENCOUNTER — Other Ambulatory Visit: Payer: Self-pay | Admitting: Sports Medicine

## 2019-06-19 DIAGNOSIS — M797 Fibromyalgia: Secondary | ICD-10-CM

## 2019-06-20 ENCOUNTER — Other Ambulatory Visit: Payer: Self-pay

## 2019-06-20 MED ORDER — FLUDROCORTISONE ACETATE 0.1 MG PO TABS: ORAL_TABLET | ORAL | 3 refills | Status: DC

## 2019-06-28 ENCOUNTER — Other Ambulatory Visit: Payer: Self-pay | Admitting: Cardiology

## 2019-07-01 ENCOUNTER — Other Ambulatory Visit: Payer: Self-pay | Admitting: Cardiology

## 2019-07-03 ENCOUNTER — Other Ambulatory Visit: Payer: Self-pay | Admitting: Cardiology

## 2019-07-05 DIAGNOSIS — Z01818 Encounter for other preprocedural examination: Secondary | ICD-10-CM | POA: Diagnosis not present

## 2019-07-05 DIAGNOSIS — D124 Benign neoplasm of descending colon: Secondary | ICD-10-CM | POA: Diagnosis not present

## 2019-07-08 DIAGNOSIS — R140 Abdominal distension (gaseous): Secondary | ICD-10-CM | POA: Diagnosis not present

## 2019-07-08 DIAGNOSIS — Z20822 Contact with and (suspected) exposure to covid-19: Secondary | ICD-10-CM | POA: Diagnosis not present

## 2019-07-08 DIAGNOSIS — R1013 Epigastric pain: Secondary | ICD-10-CM | POA: Diagnosis not present

## 2019-07-08 DIAGNOSIS — Z01818 Encounter for other preprocedural examination: Secondary | ICD-10-CM | POA: Diagnosis not present

## 2019-07-10 ENCOUNTER — Ambulatory Visit: Payer: BC Managed Care – PPO | Admitting: Cardiology

## 2019-07-10 ENCOUNTER — Encounter: Payer: Self-pay | Admitting: Cardiology

## 2019-07-10 ENCOUNTER — Other Ambulatory Visit: Payer: Self-pay

## 2019-07-10 VITALS — BP 139/72 | HR 91 | Temp 97.9°F | Ht 67.0 in | Wt 169.4 lb

## 2019-07-10 DIAGNOSIS — E1043 Type 1 diabetes mellitus with diabetic autonomic (poly)neuropathy: Secondary | ICD-10-CM | POA: Diagnosis not present

## 2019-07-10 DIAGNOSIS — Z951 Presence of aortocoronary bypass graft: Secondary | ICD-10-CM

## 2019-07-10 DIAGNOSIS — E7800 Pure hypercholesterolemia, unspecified: Secondary | ICD-10-CM

## 2019-07-10 DIAGNOSIS — G903 Multi-system degeneration of the autonomic nervous system: Secondary | ICD-10-CM

## 2019-07-10 DIAGNOSIS — I25118 Atherosclerotic heart disease of native coronary artery with other forms of angina pectoris: Secondary | ICD-10-CM | POA: Diagnosis not present

## 2019-07-10 NOTE — Progress Notes (Signed)
Subjective:  Primary Physician/Referring:  Hali Marry, MD  Patient ID: Alejandra Valdez, female    DOB: 1955/07/27, 64 y.o.   MRN: [id number]  Chief Complaint  Patient presents with  . Coronary Artery Disease   HPI: Alejandra Valdez  is a 64 y.o. female  with orthostatic hypotension with symptomatic improvement with fludrocortisone, coronary artery disease with CABG 4 in 2013 in [place] when she suffered MI in 2013 S/P CABG 2013 with LIMA to LAD, SVG to RCA and SVG to OM1 and D2. She has type 1 diabetes, orthostatic hypotension, hypothyroidism, hyperlipidemia, depression, anxiety, history of breast cancer with bilateral mastectomy, osteoporosis and chronic back pain. She has recently relocated to [place], [place].   She has occasional angina pectoris, class 1-2, presently doing well without shortness of breath, palpitations, dizziness.  She has chronic orthostasis and has been stable on Florinef chronically.  States that without taking the medication she feels markedly dizzy.  Otherwise no new symptoms.  She is gaining weight and states that recently for the 1st time diabetes has been uncontrolled.  Past Medical History:  Diagnosis Date  . ADHD (attention deficit hyperactivity disorder)   . Anxiety   . Arthritis    Of spine DDD/DJD  . CAD (coronary artery disease)    a. s/p CABG in 2013 with LIMA-LAD, SVG-RCA, and SVG-OM1-D2  . Cancer [facility]) 2012   Breast  . Depression   . Diabetes mellitus without complication ([place])   . H/O bilateral breast implants 01/24/2016  . History of colon polyps   . Hypothyroid   . Lung nodule    Stable per CT 05/2013  . Migraine   . Myocardial infarction ([place]) 2013  . Neuropathy   . Osteoporosis     Past Surgical History:  Procedure Laterality Date  . ABDOMINAL HYSTERECTOMY     has left ovary  . APPENDECTOMY    . BILATERAL CARPAL TUNNEL RELEASE    . BLEPHAROPLASTY Bilateral   . CATARACT EXTRACTION Bilateral   . CESAREAN SECTION     x2  .  CORONARY ARTERY BYPASS GRAFT  2013   x4  . MASTECTOMY Bilateral 2012  . OTHER SURGICAL HISTORY     Retinal repair  . SHOULDER SURGERY Bilateral     Social History   Socioeconomic History  . Marital status: Divorced    Spouse name: Not on file  . Number of children: 1  . Years of education: Not on file  . Highest education level: Not on file  Occupational History  . Occupation: Disabled  Tobacco Use  . Smoking status: Never Smoker  . Smokeless tobacco: Never Used  Substance and Sexual Activity  . Alcohol use: No    Alcohol/week: 0.0 standard drinks  . Drug use: No  . Sexual activity: Never    Partners: Male  Other Topics Concern  . Not on file  Social History Narrative   No daily caffeine intake. Walks for 30 minutes for exercise. She is currently disabled.   Social Determinants of Health   Financial Resource Strain:   . Difficulty of Paying Living Expenses: Not on file  Food Insecurity:   . Worried About Charity fundraiser in the Last Year: Not on file  . Ran Out of Food in the Last Year: Not on file  Transportation Needs:   . Lack of Transportation (Medical): Not on file  . Lack of Transportation (Non-Medical): Not on file  Physical Activity:   . Days of Exercise  per Week: Not on file  . Minutes of Exercise per Session: Not on file  Stress:   . Feeling of Stress : Not on file  Social Connections:   . Frequency of Communication with Friends and Family: Not on file  . Frequency of Social Gatherings with Friends and Family: Not on file  . Attends Religious Services: Not on file  . Active Member of Clubs or Organizations: Not on file  . Attends Archivist Meetings: Not on file  . Marital Status: Not on file  Intimate Partner Violence:   . Fear of Current or Ex-Partner: Not on file  . Emotionally Abused: Not on file  . Physically Abused: Not on file  . Sexually Abused: Not on file    Current Outpatient Medications on File Prior to Visit  Medication  Sig Dispense Refill  . alendronate (FOSAMAX) 70 MG tablet Take 1 tablet (70 mg total) by mouth every 7 (seven) days. Take with a full glass of water on an empty stomach. 12 tablet 3  . ARIPiprazole (ABILIFY) 10 MG tablet Take by mouth daily.    Marland Kitchen aspirin EC 81 MG tablet Take 1 tablet (81 mg total) by mouth daily. 90 tablet 3  . Botulinum Toxin Type A 200 units SOLR Inject as directed. Once every 3 months for migraines    . celecoxib (CELEBREX) 200 MG capsule Take 1 capsule (200 mg total) by mouth daily. *NO ADDITIONAL REFILLS. PLEASE CALL THE OFFICE TO SCHEDULE AN APPOINTMENT* 30 capsule 0  . cholecalciferol (VITAMIN D3) 25 MCG (1000 UT) tablet Take 1,000 Units by mouth daily.    . clonazePAM (KLONOPIN) 1 MG tablet Take 1 mg by mouth 2 (two) times daily.    . cycloSPORINE (RESTASIS) 0.05 % ophthalmic emulsion Place 1 drop into both eyes 2 (two) times daily. (Patient taking differently: Place 1 drop into both eyes daily. ) 1 each 1  . DULoxetine (CYMBALTA) 30 MG capsule TAKE 1 CAPSULE(30 MG) BY MOUTH DAILY 30 capsule 0  . ezetimibe (ZETIA) 10 MG tablet TAKE 1 TABLET BY MOUTH DAILY 90 tablet 1  . fludrocortisone (FLORINEF) 0.1 MG tablet TAKE 1 TABLET BY MOUTH IN THE EVENING AFTER DINNER 30 tablet 3  . ibandronate (BONIVA) 150 MG tablet TAKE 1 TABLET BY MOUTH EVERY 30 DAYS 3 tablet 4  . insulin aspart (NOVOLOG) 100 UNIT/ML injection Use up to 75 units daily by insulin pump. 30 mL 3  . lactulose (CHRONULAC) 10 GM/15ML solution TAKE 30 ML BY MOUTH TWICE DAILY    . lamoTRIgine (LAMICTAL) 100 MG tablet Take 1 tablet (100 mg total) daily by mouth. 30 tablet 1  . levothyroxine (SYNTHROID) 125 MCG tablet Take 1 tablet (125 mcg total) by mouth 2 (two) times a week. Then take the 187mcg the other 5 days of the week. 16 tablet 0  . metoprolol succinate (TOPROL-XL) 25 MG 24 hr tablet TAKE 1 TABLET BY MOUTH DAILY 90 tablet 3  . Na Sulfate-K Sulfate-Mg Sulf 17.5-3.13-1.6 GM/177ML SOLN At 6 PM day before procedure,  complete steps 1-4 using one 6 ounce bottle.  At 5 AM the day of procedure, repeat steps 1-4 using 2nd bottle.    . naratriptan (AMERGE) 2.5 MG tablet Take 2.5 mg by mouth. Take one tablet at onset of migraine do not take more than 1 a day  0  . nitroGLYCERIN (NITROSTAT) 0.4 MG SL tablet PLACE 1 TABLET UNDER THE TONGUE EVERY 5 MINUTES AS NEEDED FOR CHEST PAIN 25 tablet  1  . nortriptyline (PAMELOR) 10 MG capsule TAKE 1 CAPSULE(10 MG) BY MOUTH THREE TIMES DAILY. FOLLOW UP APPOINTMENT BEFORE MORE REFILLS (Patient taking differently: TAKE 1 CAPSULE(10 MG) BY MOUTH at bedtime) 30 capsule 0  . omeprazole (PRILOSEC) 40 MG capsule TAKE ONE CAPSULE BY MOUTH EVERY DAY 90 capsule 3  . simvastatin (ZOCOR) 40 MG tablet TAKE 1 TABLET BY MOUTH DAILY AT 6 PM**SCHEDULE APPT FOR FUTURE REFILLS** 30 tablet 0  . topiramate ER (QUDEXY XR) 150 MG CS24 sprinkle capsule Take by mouth daily.    . traMADol (ULTRAM) 50 MG tablet Take 1 tablet (50 mg total) by mouth every 8 (eight) hours as needed for moderate pain. Maximum 6 tabs per day. 21 tablet 0  . traZODone (DESYREL) 100 MG tablet Take 2 tablets (200 mg total) by mouth at bedtime. (Patient taking differently: Take 100 mg by mouth at bedtime. ) 180 tablet 1  . zolpidem (AMBIEN CR) 6.25 MG CR tablet Take 1 tablet by mouth at bedtime as needed.     . Acetone, Urine, Test (KETONE TEST) STRP by Does not apply route.    Marland Kitchen BAYER CONTOUR NEXT TEST test strip 150 each as needed.  3  . [DISCONTINUED] temazepam (RESTORIL) 15 MG capsule TK ONE C PO QHS  1   No current facility-administered medications on file prior to visit.   Review of Systems  Constitution: Negative for chills, decreased appetite, malaise/fatigue and weight gain.  Cardiovascular: Positive for chest pain (Occasional angina) and palpitations (occasional). Negative for dyspnea on exertion, leg swelling and syncope.  Endocrine: Negative for cold intolerance.  Hematologic/Lymphatic: Does not bruise/bleed easily.    Musculoskeletal: Negative for joint pain and joint swelling.  Gastrointestinal: Negative for abdominal pain, anorexia and change in bowel habit.  Neurological: Positive for dizziness. Negative for headaches and light-headedness.  Psychiatric/Behavioral: Negative for depression and substance abuse.  All other systems reviewed and are negative.  Objective:  Blood pressure 139/72, pulse 91, temperature 97.9 F (36.6 C), height 5\' 7"  (1.702 m), weight 169 lb 6.4 oz (76.8 kg), SpO2 97 %. Body mass index is 26.53 kg/m.   Vitals with BMI 07/10/2019 04/24/2019 03/10/2019  Height 5\' 7"  5\' 6"  -  Weight 169 lbs 6 oz 145 lbs 154 lbs  BMI [id number] -  Systolic [id number] (No Data) 0000000  Diastolic 72 (No Data) 82  Pulse 91 - 87  Some encounter information is confidential and restricted. Go to Review Flowsheets activity to see all data.    Physical Exam  Constitutional: She appears well-developed and well-nourished.  Neck: No thyromegaly present.  Cardiovascular: Normal rate, regular rhythm, normal heart sounds and intact distal pulses. Exam reveals no gallop.  No murmur heard. Pulses:      Carotid pulses are 2+ on the right side and 2+ on the left side.      Femoral pulses are 2+ on the right side and 2+ on the left side.      Popliteal pulses are 1+ on the right side and 1+ on the left side.       Dorsalis pedis pulses are 1+ on the right side and 1+ on the left side.       Posterior tibial pulses are 0 on the right side and 0 on the left side.  No leg edema, no JVD.  Pulmonary/Chest: Effort normal and breath sounds normal.  Abdominal: Soft. Bowel sounds are normal.  Musculoskeletal:     Cervical back: Neck supple.  Skin: Skin  is warm and dry.    Radiology: No results found.   Laboratory Examination:  CMP Latest Ref Rng & Units 04/15/2018 07/30/2017 05/28/2016  Glucose 65 - 99 mg/dL - 150(H) 80  BUN 6 - 20 mg/dL - 25(H) 12  Creatinine 0.44 - 1.00 mg/dL - 0.85 0.72  Sodium 135 - 145 mmol/L -  138 142  Potassium 3.5 - 5.1 mmol/L - 3.6 4.3  Chloride 101 - 111 mmol/L - 107 109  CO2 22 - 32 mmol/L - 25 21  Calcium 8.9 - 10.3 mg/dL - 8.7(L) 9.0  Total Protein 6.1 - 8.1 g/dL - - 6.0(L)  Total Bilirubin 0.2 - 1.2 mg/dL - - 0.5  Alkaline Phos - 72 - 52  AST - 30 - 25  ALT - 35 - 17   CBC Latest Ref Rng & Units 04/12/2018 04/11/2015  WBC - 3.2 4.3  Hemoglobin 12.0 - 16.0 13.3 14.6  Hematocrit 36 - 46 39 44.4  Platelets 150 - 400 K/uL - 253   Lipid Panel     Component Value Date/Time   CHOL 123 05/28/2016 1527   TRIG 79 05/28/2016 1527   HDL 53 05/28/2016 1527   CHOLHDL 2.3 05/28/2016 1527   VLDL 16 05/28/2016 1527   LDLCALC 54 05/28/2016 1527   HEMOGLOBIN A1C Lab Results  Component Value Date   HGBA1C 7.3 (H) 11/10/2018   MPG 163 11/10/2018   TSH Recent Labs    08/08/18 1556  TSH 7.17*   Cardiac studies:   Lexiscan Myoview stress test 05/11/2016:  Normal EF, no evidence of ischemia, low risk study  Echocardiogram 11/30/2016:  Normal LV systolic function, EF 123456.  No wall motion of mortality.  Pseudo-normal LV diastolic function.  No significant valvular abnormality.  Assessment:      ICD-10-CM   1. Coronary artery disease of native artery of native heart with stable angina pectoris ([place])  I25.118 EKG 12-Lead  2. Hx of CABG  2013: LIMA to LAD, SVG to RCA and SVG to OM1 and D2.  Z95.1   3. Hypercholesteremia  E78.00   4. Type I diabetes mellitus with peripheral autonomic neuropathy ([facility])  E10.43   5. Neurogenic orthostatic hypotension ([facility])  G90.3     EKG 07/10/2019: Normal sinus rhythm at rate of 94 bpm, left atrial enlargement, normal axis.  Incomplete right bundle branch block.  Poor R-wave progression, cannot exclude anteroseptal infarct old.  No evidence of ischemia. No significant change from  EKG 07/18/2018   Recommendations:    HPI: Alejandra Valdez  is a 64 y.o. female  with orthostatic hypotension with symptomatic improvement with fludrocortisone,  coronary artery disease with CABG 4 in 2013 in [place] when she suffered MI in 2013 S/P CABG 2013 with LIMA to LAD, SVG to RCA and SVG to OM1 and D2. She has type 1 diabetes, hypothyroidism, hyperlipidemia, depression, anxiety, history of breast cancer with bilateral mastectomy, osteoporosis and chronic back pain.  She is presently doing well and has class II stable angina pectoris. Lipids are well controlled, only recently diabetes is uncontrolled and she also has gained weight. Low calorie diet discussed for weight loss.  Lipids at goal. Physical exam is unremarkable except for reduced pedal pulse, no symptoms of claudication and capillary refill is normal.  She has no hypertension history, but due to CAD and resting elevated HR, has been on beta blockers which I have continued.  Due to severe dizziness episodes without flurinef, she has been on it chronically  and not on ACEi or ARB needed in view of DM.  As patient was previously frequently travelling, this was not challenged and I had not made significant medication changes.  She now has relocated from [facility] to [facility] and will be establishing with Cardiology there.  She could be challenged with ACEi or ARB with close monitoring. Her orthostasis could potentially be contributed by duloxetine, nortriptyline and Abilify along with potentially Topamax and trazodone.  She is scheduled for colonoscopy and EGD and I do not see any contraindication for the same.  I did not make a return visit appointment today and will try to set her up to see a cardiologist in the [place] [facility].   Adrian Prows, MD, [facility] 07/11/2019, 7:52 AM [facility]

## 2019-07-11 ENCOUNTER — Encounter: Payer: Self-pay | Admitting: Cardiology

## 2019-07-11 DIAGNOSIS — F329 Major depressive disorder, single episode, unspecified: Secondary | ICD-10-CM | POA: Diagnosis not present

## 2019-07-11 DIAGNOSIS — E109 Type 1 diabetes mellitus without complications: Secondary | ICD-10-CM | POA: Diagnosis not present

## 2019-07-11 DIAGNOSIS — I340 Nonrheumatic mitral (valve) insufficiency: Secondary | ICD-10-CM | POA: Diagnosis not present

## 2019-07-11 DIAGNOSIS — D126 Benign neoplasm of colon, unspecified: Secondary | ICD-10-CM | POA: Diagnosis not present

## 2019-07-11 DIAGNOSIS — R140 Abdominal distension (gaseous): Secondary | ICD-10-CM | POA: Diagnosis not present

## 2019-07-11 DIAGNOSIS — Z9049 Acquired absence of other specified parts of digestive tract: Secondary | ICD-10-CM | POA: Diagnosis not present

## 2019-07-11 DIAGNOSIS — E039 Hypothyroidism, unspecified: Secondary | ICD-10-CM | POA: Diagnosis not present

## 2019-07-11 DIAGNOSIS — K219 Gastro-esophageal reflux disease without esophagitis: Secondary | ICD-10-CM | POA: Diagnosis not present

## 2019-07-11 DIAGNOSIS — D124 Benign neoplasm of descending colon: Secondary | ICD-10-CM | POA: Diagnosis not present

## 2019-07-11 DIAGNOSIS — K5909 Other constipation: Secondary | ICD-10-CM | POA: Diagnosis not present

## 2019-07-11 DIAGNOSIS — Z853 Personal history of malignant neoplasm of breast: Secondary | ICD-10-CM | POA: Diagnosis not present

## 2019-07-11 DIAGNOSIS — R1013 Epigastric pain: Secondary | ICD-10-CM | POA: Diagnosis not present

## 2019-07-11 DIAGNOSIS — I2510 Atherosclerotic heart disease of native coronary artery without angina pectoris: Secondary | ICD-10-CM | POA: Diagnosis not present

## 2019-07-11 DIAGNOSIS — K581 Irritable bowel syndrome with constipation: Secondary | ICD-10-CM | POA: Diagnosis not present

## 2019-07-11 DIAGNOSIS — Z9013 Acquired absence of bilateral breasts and nipples: Secondary | ICD-10-CM | POA: Diagnosis not present

## 2019-07-11 DIAGNOSIS — K5730 Diverticulosis of large intestine without perforation or abscess without bleeding: Secondary | ICD-10-CM | POA: Diagnosis not present

## 2019-07-11 DIAGNOSIS — K319 Disease of stomach and duodenum, unspecified: Secondary | ICD-10-CM | POA: Diagnosis not present

## 2019-07-11 DIAGNOSIS — I252 Old myocardial infarction: Secondary | ICD-10-CM | POA: Diagnosis not present

## 2019-07-13 DIAGNOSIS — D0512 Intraductal carcinoma in situ of left breast: Secondary | ICD-10-CM | POA: Diagnosis not present

## 2019-07-13 DIAGNOSIS — F3289 Other specified depressive episodes: Secondary | ICD-10-CM | POA: Diagnosis not present

## 2019-07-14 DIAGNOSIS — K5909 Other constipation: Secondary | ICD-10-CM | POA: Diagnosis not present

## 2019-07-14 DIAGNOSIS — R141 Gas pain: Secondary | ICD-10-CM | POA: Diagnosis not present

## 2019-07-14 DIAGNOSIS — R140 Abdominal distension (gaseous): Secondary | ICD-10-CM | POA: Diagnosis not present

## 2019-07-19 DIAGNOSIS — E103599 Type 1 diabetes mellitus with proliferative diabetic retinopathy without macular edema, unspecified eye: Secondary | ICD-10-CM | POA: Diagnosis not present

## 2019-07-19 DIAGNOSIS — E1065 Type 1 diabetes mellitus with hyperglycemia: Secondary | ICD-10-CM | POA: Diagnosis not present

## 2019-07-19 DIAGNOSIS — E103513 Type 1 diabetes mellitus with proliferative diabetic retinopathy with macular edema, bilateral: Secondary | ICD-10-CM | POA: Diagnosis not present

## 2019-07-21 DIAGNOSIS — S46911A Strain of unspecified muscle, fascia and tendon at shoulder and upper arm level, right arm, initial encounter: Secondary | ICD-10-CM | POA: Diagnosis not present

## 2019-07-21 DIAGNOSIS — M19011 Primary osteoarthritis, right shoulder: Secondary | ICD-10-CM | POA: Diagnosis not present

## 2019-07-21 DIAGNOSIS — M25612 Stiffness of left shoulder, not elsewhere classified: Secondary | ICD-10-CM | POA: Diagnosis not present

## 2019-07-21 DIAGNOSIS — Z6826 Body mass index (BMI) 26.0-26.9, adult: Secondary | ICD-10-CM | POA: Diagnosis not present

## 2019-07-21 DIAGNOSIS — M25512 Pain in left shoulder: Secondary | ICD-10-CM | POA: Diagnosis not present

## 2019-07-21 DIAGNOSIS — M85811 Other specified disorders of bone density and structure, right shoulder: Secondary | ICD-10-CM | POA: Diagnosis not present

## 2019-07-21 DIAGNOSIS — S4991XA Unspecified injury of right shoulder and upper arm, initial encounter: Secondary | ICD-10-CM | POA: Diagnosis not present

## 2019-07-24 ENCOUNTER — Other Ambulatory Visit: Payer: Self-pay

## 2019-07-24 ENCOUNTER — Other Ambulatory Visit: Payer: Self-pay | Admitting: Sports Medicine

## 2019-07-24 DIAGNOSIS — W19XXXA Unspecified fall, initial encounter: Secondary | ICD-10-CM

## 2019-07-24 DIAGNOSIS — I25118 Atherosclerotic heart disease of native coronary artery with other forms of angina pectoris: Secondary | ICD-10-CM

## 2019-07-24 MED ORDER — TRAMADOL HCL 50 MG PO TABS: 50.0000 mg | ORAL_TABLET | Freq: Three times a day (TID) | ORAL | 0 refills | Status: DC | PRN

## 2019-07-24 MED ORDER — FLUDROCORTISONE ACETATE 0.1 MG PO TABS: ORAL_TABLET | ORAL | 3 refills | Status: DC

## 2019-07-28 ENCOUNTER — Other Ambulatory Visit: Payer: Self-pay | Admitting: Sports Medicine

## 2019-07-28 DIAGNOSIS — M797 Fibromyalgia: Secondary | ICD-10-CM

## 2019-07-31 DIAGNOSIS — S46911A Strain of unspecified muscle, fascia and tendon at shoulder and upper arm level, right arm, initial encounter: Secondary | ICD-10-CM | POA: Diagnosis not present

## 2019-07-31 DIAGNOSIS — S4991XA Unspecified injury of right shoulder and upper arm, initial encounter: Secondary | ICD-10-CM | POA: Diagnosis not present

## 2019-07-31 DIAGNOSIS — M85811 Other specified disorders of bone density and structure, right shoulder: Secondary | ICD-10-CM | POA: Diagnosis not present

## 2019-07-31 DIAGNOSIS — M19011 Primary osteoarthritis, right shoulder: Secondary | ICD-10-CM | POA: Diagnosis not present

## 2019-07-31 DIAGNOSIS — Z6826 Body mass index (BMI) 26.0-26.9, adult: Secondary | ICD-10-CM | POA: Diagnosis not present

## 2019-08-07 DIAGNOSIS — Z789 Other specified health status: Secondary | ICD-10-CM | POA: Diagnosis not present

## 2019-08-07 DIAGNOSIS — L820 Inflamed seborrheic keratosis: Secondary | ICD-10-CM | POA: Diagnosis not present

## 2019-08-07 DIAGNOSIS — D229 Melanocytic nevi, unspecified: Secondary | ICD-10-CM | POA: Diagnosis not present

## 2019-08-07 DIAGNOSIS — R208 Other disturbances of skin sensation: Secondary | ICD-10-CM | POA: Diagnosis not present

## 2019-08-07 DIAGNOSIS — L853 Xerosis cutis: Secondary | ICD-10-CM | POA: Diagnosis not present

## 2019-08-07 DIAGNOSIS — L821 Other seborrheic keratosis: Secondary | ICD-10-CM | POA: Diagnosis not present

## 2019-08-07 DIAGNOSIS — L538 Other specified erythematous conditions: Secondary | ICD-10-CM | POA: Diagnosis not present

## 2019-08-07 DIAGNOSIS — L578 Other skin changes due to chronic exposure to nonionizing radiation: Secondary | ICD-10-CM | POA: Diagnosis not present

## 2019-08-10 DIAGNOSIS — F3340 Major depressive disorder, recurrent, in remission, unspecified: Secondary | ICD-10-CM | POA: Diagnosis not present

## 2019-08-27 ENCOUNTER — Other Ambulatory Visit: Payer: Self-pay | Admitting: Sports Medicine

## 2019-08-27 DIAGNOSIS — M797 Fibromyalgia: Secondary | ICD-10-CM

## 2019-08-30 DIAGNOSIS — R918 Other nonspecific abnormal finding of lung field: Secondary | ICD-10-CM | POA: Diagnosis not present

## 2019-09-11 DIAGNOSIS — F3340 Major depressive disorder, recurrent, in remission, unspecified: Secondary | ICD-10-CM | POA: Diagnosis not present

## 2019-09-14 DIAGNOSIS — E559 Vitamin D deficiency, unspecified: Secondary | ICD-10-CM | POA: Diagnosis not present

## 2019-09-14 DIAGNOSIS — E538 Deficiency of other specified B group vitamins: Secondary | ICD-10-CM | POA: Diagnosis not present

## 2019-09-14 DIAGNOSIS — R6889 Other general symptoms and signs: Secondary | ICD-10-CM | POA: Diagnosis not present

## 2019-09-14 DIAGNOSIS — R7309 Other abnormal glucose: Secondary | ICD-10-CM | POA: Diagnosis not present

## 2019-09-15 ENCOUNTER — Other Ambulatory Visit: Payer: Self-pay | Admitting: Cardiology

## 2019-09-18 DIAGNOSIS — E103593 Type 1 diabetes mellitus with proliferative diabetic retinopathy without macular edema, bilateral: Secondary | ICD-10-CM | POA: Diagnosis not present

## 2019-09-18 DIAGNOSIS — H5319 Other subjective visual disturbances: Secondary | ICD-10-CM | POA: Diagnosis not present

## 2019-09-18 DIAGNOSIS — D3131 Benign neoplasm of right choroid: Secondary | ICD-10-CM | POA: Diagnosis not present

## 2019-09-20 DIAGNOSIS — R918 Other nonspecific abnormal finding of lung field: Secondary | ICD-10-CM | POA: Diagnosis not present

## 2019-10-04 ENCOUNTER — Telehealth: Payer: Self-pay

## 2019-10-04 DIAGNOSIS — R0600 Dyspnea, unspecified: Secondary | ICD-10-CM

## 2019-10-04 DIAGNOSIS — R0609 Other forms of dyspnea: Secondary | ICD-10-CM

## 2019-10-04 DIAGNOSIS — I5031 Acute diastolic (congestive) heart failure: Secondary | ICD-10-CM

## 2019-10-04 NOTE — Telephone Encounter (Signed)
Patient would like to proceed with labs and Xray. PCP is Swathi Anche @ [facility].

## 2019-10-04 NOTE — Telephone Encounter (Signed)
Received message from patient that is concerned about recent weight gain. Patient states she has gained almost thirty pounds in the past three weeks (especially in extremities) and is concerned that it could be related to her heart. Patient stated she has not had a change in her diet. Please advise. Thanks!

## 2019-10-04 NOTE — Telephone Encounter (Signed)
I could order some labs and see what is happening. She can then come to see me. If labs and Xray do not suggest excess fluid, then something else is going on. She can have labs done in [place] and needs a CXR and can come here prior to my visit to have it done first so I will have the report. I can put orders if she wants. Does she have a PCP there now? Alejandra Valdez

## 2019-10-04 NOTE — Addendum Note (Signed)
Addended by: Kela Millin on: 10/04/2019 04:19 PM   Modules accepted: Orders

## 2019-10-06 DIAGNOSIS — F3340 Major depressive disorder, recurrent, in remission, unspecified: Secondary | ICD-10-CM | POA: Diagnosis not present

## 2019-10-09 ENCOUNTER — Telehealth: Payer: Self-pay

## 2019-10-09 NOTE — Telephone Encounter (Signed)
Patient was called to inform of Dr Milderd Meager orders. Patient was busy at the time and stated she was going to call us tomorrow.  Thank you,   -Leda Quail

## 2019-10-23 DIAGNOSIS — M19072 Primary osteoarthritis, left ankle and foot: Secondary | ICD-10-CM | POA: Diagnosis not present

## 2019-10-23 DIAGNOSIS — M79672 Pain in left foot: Secondary | ICD-10-CM | POA: Diagnosis not present

## 2019-10-23 DIAGNOSIS — I872 Venous insufficiency (chronic) (peripheral): Secondary | ICD-10-CM | POA: Diagnosis not present

## 2019-11-03 DIAGNOSIS — F339 Major depressive disorder, recurrent, unspecified: Secondary | ICD-10-CM | POA: Diagnosis not present

## 2019-11-06 ENCOUNTER — Ambulatory Visit
Admission: RE | Admit: 2019-11-06 | Discharge: 2019-11-06 | Disposition: A | Discharge status: Home / Self Care | Payer: BC Managed Care – PPO | Source: Ambulatory Visit | Attending: Cardiology | Admitting: Cardiology

## 2019-11-06 DIAGNOSIS — E1042 Type 1 diabetes mellitus with diabetic polyneuropathy: Secondary | ICD-10-CM | POA: Diagnosis not present

## 2019-11-06 DIAGNOSIS — R0600 Dyspnea, unspecified: Secondary | ICD-10-CM

## 2019-11-06 DIAGNOSIS — R0609 Other forms of dyspnea: Secondary | ICD-10-CM

## 2019-11-06 DIAGNOSIS — R0602 Shortness of breath: Secondary | ICD-10-CM | POA: Diagnosis not present

## 2019-11-06 DIAGNOSIS — I5031 Acute diastolic (congestive) heart failure: Secondary | ICD-10-CM

## 2019-11-06 DIAGNOSIS — N200 Calculus of kidney: Secondary | ICD-10-CM | POA: Diagnosis not present

## 2019-11-06 DIAGNOSIS — I2510 Atherosclerotic heart disease of native coronary artery without angina pectoris: Secondary | ICD-10-CM | POA: Diagnosis not present

## 2019-11-06 DIAGNOSIS — G43719 Chronic migraine without aura, intractable, without status migrainosus: Secondary | ICD-10-CM | POA: Diagnosis not present

## 2019-11-07 NOTE — Progress Notes (Signed)
I am not sure if this is fluid retention and heart failure or something else. She has labs pending at [place] and can go in [place] as well and then I can decide. If I need to see her after that I will let her know, JG

## 2019-11-07 NOTE — Progress Notes (Signed)
I called patient regarding CXR results. Patient stated that she has put on 2lbs a day and her legs are swollen and wants to know what she should do.

## 2019-11-08 ENCOUNTER — Other Ambulatory Visit: Payer: Self-pay | Admitting: Cardiology

## 2019-11-08 DIAGNOSIS — E039 Hypothyroidism, unspecified: Secondary | ICD-10-CM | POA: Diagnosis not present

## 2019-11-08 DIAGNOSIS — I5031 Acute diastolic (congestive) heart failure: Secondary | ICD-10-CM

## 2019-11-08 NOTE — Progress Notes (Signed)
I have placed orders she can to tomorrow

## 2019-11-08 NOTE — Progress Notes (Signed)
Patient called back, she would rather go to [place] and will go to [facility]

## 2019-11-09 DIAGNOSIS — I5031 Acute diastolic (congestive) heart failure: Secondary | ICD-10-CM | POA: Diagnosis not present

## 2019-11-09 NOTE — Progress Notes (Signed)
Patient is aware 

## 2019-11-10 ENCOUNTER — Telehealth (INDEPENDENT_AMBULATORY_CARE_PROVIDER_SITE_OTHER): Payer: BC Managed Care – PPO | Admitting: Sports Medicine

## 2019-11-10 ENCOUNTER — Other Ambulatory Visit: Payer: Self-pay

## 2019-11-10 DIAGNOSIS — M797 Fibromyalgia: Secondary | ICD-10-CM

## 2019-11-10 LAB — BASIC METABOLIC PANEL WITH GFR
BUN/Creatinine Ratio: 24 (ref 12–28)
BUN: 34 mg/dL — ABNORMAL HIGH (ref 8–27)
CO2: 25 mmol/L (ref 20–29)
Calcium: 8.7 mg/dL (ref 8.7–10.3)
Chloride: 104 mmol/L (ref 96–106)
Creatinine, Ser: 1.43 mg/dL — ABNORMAL HIGH (ref 0.57–1.00)
GFR calc Af Amer: 46 mL/min/1.73 — ABNORMAL LOW
GFR calc non Af Amer: 40 mL/min/1.73 — ABNORMAL LOW
Glucose: 98 mg/dL (ref 65–99)
Potassium: 4.6 mmol/L (ref 3.5–5.2)
Sodium: 142 mmol/L (ref 134–144)

## 2019-11-10 LAB — BRAIN NATRIURETIC PEPTIDE: BNP: 27.5 pg/mL (ref 0.0–100.0)

## 2019-11-10 MED ORDER — PREGABALIN 50 MG PO CAPS: 50.0000 mg | ORAL_CAPSULE | Freq: Three times a day (TID) | ORAL | 3 refills | Status: DC

## 2019-11-10 NOTE — Assessment & Plan Note (Signed)
Alejandra Valdez returns, she is a pleasant 64 year old female, she has history of fibromyalgia with hyperalgesia and allodynia. 2 years ago we discussed transition from [place] to Cymbalta and the addition of Lyrica but she never followed through with this. She did not respond to gabapentin in the past. We are going to leave the Viibryd alone, I am simply going to add Lyrica 50 mg 3 times daily, we can uptitrate per monthly as needed. I will see her back in a month for a virtual/Doximity visit.

## 2019-11-10 NOTE — Progress Notes (Signed)
   Virtual Visit via Telephone   I connected with  Alejandra Valdez  on 11/10/19 by telephone/telehealth and verified that I am speaking with the correct person using two identifiers.   I discussed the limitations, risks, security and privacy concerns of performing an evaluation and management service by telephone, including the higher likelihood of inaccurate diagnosis and treatment, and the availability of in person appointments.  We also discussed the likely need of an additional face to face encounter for complete and high quality delivery of care.  I also discussed with the patient that there may be a patient responsible charge related to this service. The patient expressed understanding and wishes to proceed.  Provider location is either at home or medical facility. Patient location is at their home, different from provider location. People involved in care of the patient during this telehealth encounter were myself, my nurse/medical assistant, and my front office/scheduling team member.  Review of Systems: No fevers, chills, night sweats, weight loss, chest pain, or shortness of breath.   Objective Findings:    General: Speaking full sentences, no audible heavy breathing.  Sounds alert and appropriately interactive.    Independent interpretation of tests performed by another provider:   None.  Brief History, Exam, Impression, and Recommendations:    Fibromyalgia Alejandra Valdez returns, she is a pleasant 64 year old female, she has history of fibromyalgia with hyperalgesia and allodynia. 2 years ago we discussed transition from [place] to Cymbalta and the addition of Lyrica but she never followed through with this. She did not respond to gabapentin in the past. We are going to leave the Viibryd alone, I am simply going to add Lyrica 50 mg 3 times daily, we can uptitrate per monthly as needed. I will see her back in a month for a virtual/Doximity visit.   I discussed the above assessment and  treatment plan with the patient. The patient was provided an opportunity to ask questions and all were answered. The patient agreed with the plan and demonstrated an understanding of the instructions.   The patient was advised to call back or seek an in-person evaluation if the symptoms worsen or if the condition fails to improve as anticipated.   I provided 30 minutes of face to face and non-face-to-face time during this encounter date, time was needed to gather information, review chart, records, communicate/coordinate with staff remotely, as well as complete documentation.   ___________________________________________ Gwen Her. Dianah Field, M.D., ABFM., CAQSM. Primary Care and Sports Medicine [facility]  Adjunct Professor of [place] of [employer] [employer]

## 2019-11-11 NOTE — Progress Notes (Signed)
Labs show new renal failure stage 3b and no lab evidence of heart failure. She should see her PCP at her earliest but not emergent to figure this out, doubt heart related.

## 2019-11-13 ENCOUNTER — Telehealth: Payer: Self-pay

## 2019-11-13 NOTE — Telephone Encounter (Signed)
I called patient back and spoke with her.

## 2019-11-13 NOTE — Progress Notes (Signed)
Called and spoke with patient regarding her lab results.

## 2019-12-08 DIAGNOSIS — F349 Persistent mood [affective] disorder, unspecified: Secondary | ICD-10-CM | POA: Diagnosis not present

## 2019-12-08 DIAGNOSIS — E1065 Type 1 diabetes mellitus with hyperglycemia: Secondary | ICD-10-CM | POA: Diagnosis not present

## 2019-12-08 DIAGNOSIS — I10 Essential (primary) hypertension: Secondary | ICD-10-CM | POA: Diagnosis not present

## 2019-12-08 DIAGNOSIS — E079 Disorder of thyroid, unspecified: Secondary | ICD-10-CM | POA: Diagnosis not present

## 2019-12-11 DIAGNOSIS — I25708 Atherosclerosis of coronary artery bypass graft(s), unspecified, with other forms of angina pectoris: Secondary | ICD-10-CM | POA: Diagnosis not present

## 2019-12-12 DIAGNOSIS — F4312 Post-traumatic stress disorder, chronic: Secondary | ICD-10-CM | POA: Diagnosis not present

## 2019-12-15 ENCOUNTER — Other Ambulatory Visit: Payer: Self-pay | Admitting: Family Medicine

## 2020-01-01 DIAGNOSIS — F339 Major depressive disorder, recurrent, unspecified: Secondary | ICD-10-CM | POA: Diagnosis not present

## 2020-01-03 ENCOUNTER — Other Ambulatory Visit: Payer: Self-pay | Admitting: Cardiology

## 2020-01-26 DIAGNOSIS — F4312 Post-traumatic stress disorder, chronic: Secondary | ICD-10-CM | POA: Diagnosis not present

## 2020-02-05 DIAGNOSIS — M47819 Spondylosis without myelopathy or radiculopathy, site unspecified: Secondary | ICD-10-CM | POA: Diagnosis not present

## 2020-02-05 DIAGNOSIS — E1065 Type 1 diabetes mellitus with hyperglycemia: Secondary | ICD-10-CM | POA: Diagnosis not present

## 2020-02-05 DIAGNOSIS — M5030 Other cervical disc degeneration, unspecified cervical region: Secondary | ICD-10-CM | POA: Diagnosis not present

## 2020-02-05 DIAGNOSIS — E038 Other specified hypothyroidism: Secondary | ICD-10-CM | POA: Diagnosis not present

## 2020-02-08 DIAGNOSIS — E039 Hypothyroidism, unspecified: Secondary | ICD-10-CM | POA: Diagnosis not present

## 2020-02-08 DIAGNOSIS — E109 Type 1 diabetes mellitus without complications: Secondary | ICD-10-CM | POA: Diagnosis not present

## 2020-02-09 DIAGNOSIS — F4312 Post-traumatic stress disorder, chronic: Secondary | ICD-10-CM | POA: Diagnosis not present

## 2020-02-12 ENCOUNTER — Other Ambulatory Visit: Payer: Self-pay | Admitting: Cardiology

## 2020-02-13 DIAGNOSIS — I872 Venous insufficiency (chronic) (peripheral): Secondary | ICD-10-CM | POA: Diagnosis not present

## 2020-02-13 DIAGNOSIS — M19072 Primary osteoarthritis, left ankle and foot: Secondary | ICD-10-CM | POA: Diagnosis not present

## 2020-02-13 DIAGNOSIS — M19071 Primary osteoarthritis, right ankle and foot: Secondary | ICD-10-CM | POA: Diagnosis not present

## 2020-02-13 DIAGNOSIS — I739 Peripheral vascular disease, unspecified: Secondary | ICD-10-CM | POA: Diagnosis not present

## 2020-02-13 DIAGNOSIS — L97521 Non-pressure chronic ulcer of other part of left foot limited to breakdown of skin: Secondary | ICD-10-CM | POA: Diagnosis not present

## 2020-02-13 DIAGNOSIS — S91302A Unspecified open wound, left foot, initial encounter: Secondary | ICD-10-CM | POA: Diagnosis not present

## 2020-02-21 DIAGNOSIS — M7918 Myalgia, other site: Secondary | ICD-10-CM | POA: Diagnosis not present

## 2020-02-21 DIAGNOSIS — M47816 Spondylosis without myelopathy or radiculopathy, lumbar region: Secondary | ICD-10-CM | POA: Diagnosis not present

## 2020-02-21 DIAGNOSIS — M797 Fibromyalgia: Secondary | ICD-10-CM | POA: Diagnosis not present

## 2020-02-21 DIAGNOSIS — M47812 Spondylosis without myelopathy or radiculopathy, cervical region: Secondary | ICD-10-CM | POA: Diagnosis not present

## 2020-02-23 DIAGNOSIS — M542 Cervicalgia: Secondary | ICD-10-CM | POA: Diagnosis not present

## 2020-02-23 DIAGNOSIS — M47817 Spondylosis without myelopathy or radiculopathy, lumbosacral region: Secondary | ICD-10-CM | POA: Diagnosis not present

## 2020-02-23 DIAGNOSIS — I700 Atherosclerosis of aorta: Secondary | ICD-10-CM | POA: Diagnosis not present

## 2020-02-23 DIAGNOSIS — M47812 Spondylosis without myelopathy or radiculopathy, cervical region: Secondary | ICD-10-CM | POA: Diagnosis not present

## 2020-02-23 DIAGNOSIS — M47816 Spondylosis without myelopathy or radiculopathy, lumbar region: Secondary | ICD-10-CM | POA: Diagnosis not present

## 2020-02-23 DIAGNOSIS — F4312 Post-traumatic stress disorder, chronic: Secondary | ICD-10-CM | POA: Diagnosis not present

## 2020-03-11 DIAGNOSIS — E039 Hypothyroidism, unspecified: Secondary | ICD-10-CM | POA: Diagnosis not present

## 2020-03-11 DIAGNOSIS — E109 Type 1 diabetes mellitus without complications: Secondary | ICD-10-CM | POA: Diagnosis not present

## 2020-03-13 DIAGNOSIS — M7918 Myalgia, other site: Secondary | ICD-10-CM | POA: Diagnosis not present

## 2020-03-18 DIAGNOSIS — E103593 Type 1 diabetes mellitus with proliferative diabetic retinopathy without macular edema, bilateral: Secondary | ICD-10-CM | POA: Diagnosis not present

## 2020-03-18 DIAGNOSIS — D3131 Benign neoplasm of right choroid: Secondary | ICD-10-CM | POA: Diagnosis not present

## 2020-03-19 DIAGNOSIS — F4312 Post-traumatic stress disorder, chronic: Secondary | ICD-10-CM | POA: Diagnosis not present

## 2020-03-26 DIAGNOSIS — M47816 Spondylosis without myelopathy or radiculopathy, lumbar region: Secondary | ICD-10-CM | POA: Diagnosis not present

## 2020-03-26 DIAGNOSIS — M47817 Spondylosis without myelopathy or radiculopathy, lumbosacral region: Secondary | ICD-10-CM | POA: Diagnosis not present

## 2020-03-26 DIAGNOSIS — R52 Pain, unspecified: Secondary | ICD-10-CM | POA: Diagnosis not present

## 2020-03-26 DIAGNOSIS — F4312 Post-traumatic stress disorder, chronic: Secondary | ICD-10-CM | POA: Diagnosis not present

## 2020-03-27 DIAGNOSIS — D2271 Melanocytic nevi of right lower limb, including hip: Secondary | ICD-10-CM | POA: Diagnosis not present

## 2020-03-27 DIAGNOSIS — L814 Other melanin hyperpigmentation: Secondary | ICD-10-CM | POA: Diagnosis not present

## 2020-04-02 ENCOUNTER — Telehealth: Payer: Self-pay | Admitting: *Deleted

## 2020-04-02 NOTE — Telephone Encounter (Signed)
Please schedule pt an appointment for paid med refill.

## 2020-04-03 NOTE — Telephone Encounter (Signed)
Taken care of

## 2020-04-09 ENCOUNTER — Telehealth (INDEPENDENT_AMBULATORY_CARE_PROVIDER_SITE_OTHER): Payer: BC Managed Care – PPO | Admitting: Sports Medicine

## 2020-04-09 DIAGNOSIS — M5030 Other cervical disc degeneration, unspecified cervical region: Secondary | ICD-10-CM | POA: Diagnosis not present

## 2020-04-09 DIAGNOSIS — M797 Fibromyalgia: Secondary | ICD-10-CM | POA: Diagnosis not present

## 2020-04-09 DIAGNOSIS — F4312 Post-traumatic stress disorder, chronic: Secondary | ICD-10-CM | POA: Diagnosis not present

## 2020-04-09 MED ORDER — TRAMADOL-ACETAMINOPHEN 37.5-325 MG PO TABS: 1.0000 | ORAL_TABLET | Freq: Three times a day (TID) | ORAL | 0 refills | Status: DC | PRN

## 2020-04-09 MED ORDER — PREGABALIN 100 MG PO CAPS: 100.0000 mg | ORAL_CAPSULE | Freq: Three times a day (TID) | ORAL | 3 refills | Status: DC

## 2020-04-09 NOTE — Assessment & Plan Note (Signed)
Increasing Lyrica, switching to Ultracet. She has right periscapular symptoms, we already did PT, if persistent discomfort at the follow-up visit we will also consider MRI for cervical intervention versus trigger point injections for her fibromyalgia as below.

## 2020-04-09 NOTE — Progress Notes (Signed)
   Virtual Visit via Telephone   I connected with  Alejandra Valdez  on 04/09/20 by telephone/telehealth and verified that I am speaking with the correct person using two identifiers.   I discussed the limitations, risks, security and privacy concerns of performing an evaluation and management service by telephone, including the higher likelihood of inaccurate diagnosis and treatment, and the availability of in person appointments.  We also discussed the likely need of an additional face to face encounter for complete and high quality delivery of care.  I also discussed with the patient that there may be a patient responsible charge related to this service. The patient expressed understanding and wishes to proceed.  Provider location is in medical facility. Patient location is at their home, different from provider location. People involved in care of the patient during this telehealth encounter were myself, my nurse/medical assistant, and my front office/scheduling team member.  Review of Systems: No fevers, chills, night sweats, weight loss, chest pain, or shortness of breath.   Objective Findings:    General: Speaking full sentences, no audible heavy breathing.  Sounds alert and appropriately interactive.    Independent interpretation of tests performed by another provider:   None.  Brief History, Exam, Impression, and Recommendations:    Fibromyalgia Alejandra Valdez is a pleasant 64 year old female with fibromyalgia, she also has some cervical DDD. She has been with a provider at [facility]. Historically things improved with the addition of Lyrica 50 mg 3 times daily after failure of gabapentin, we will increase to 100 mg 3 times daily, I am also going to switch Valdez from tramadol to Ultracet. She can continue Valdez nortriptyline and Celebrex, no changes. I would like to see Valdez back in about a month.  DDD (degenerative disc disease), cervical Increasing Lyrica, switching to Ultracet. She  has right periscapular symptoms, we already did PT, if persistent discomfort at the follow-up visit we will also consider MRI for cervical intervention versus trigger point injections for Valdez fibromyalgia as below.   I discussed the above assessment and treatment plan with the patient. The patient was provided an opportunity to ask questions and all were answered. The patient agreed with the plan and demonstrated an understanding of the instructions.   The patient was advised to call back or seek an in-person evaluation if the symptoms worsen or if the condition fails to improve as anticipated.   I provided 30 minutes of face to face and non-face-to-face time during this encounter date, time was needed to gather information, review chart, records, communicate/coordinate with staff remotely, as well as complete documentation.   ___________________________________________ Alejandra Valdez. Dianah Field, M.D., ABFM., CAQSM. Primary Care and Sports Medicine [facility]  Adjunct Professor of [place] of [employer] [facility] [employer]

## 2020-04-09 NOTE — Assessment & Plan Note (Signed)
Alejandra Valdez is a pleasant 64 year old female with fibromyalgia, she also has some cervical DDD. She has been with a provider at [facility]. Historically things improved with the addition of Lyrica 50 mg 3 times daily after failure of gabapentin, we will increase to 100 mg 3 times daily, I am also going to switch her from tramadol to Ultracet. She can continue her nortriptyline and Celebrex, no changes. I would like to see her back in about a month.

## 2020-04-15 ENCOUNTER — Ambulatory Visit (INDEPENDENT_AMBULATORY_CARE_PROVIDER_SITE_OTHER): Payer: BC Managed Care – PPO | Admitting: Sports Medicine

## 2020-04-15 ENCOUNTER — Ambulatory Visit (INDEPENDENT_AMBULATORY_CARE_PROVIDER_SITE_OTHER): Payer: BC Managed Care – PPO

## 2020-04-15 ENCOUNTER — Other Ambulatory Visit: Payer: Self-pay

## 2020-04-15 DIAGNOSIS — M4807 Spinal stenosis, lumbosacral region: Secondary | ICD-10-CM | POA: Diagnosis not present

## 2020-04-15 DIAGNOSIS — M5136 Other intervertebral disc degeneration, lumbar region: Secondary | ICD-10-CM

## 2020-04-15 DIAGNOSIS — M5030 Other cervical disc degeneration, unspecified cervical region: Secondary | ICD-10-CM | POA: Diagnosis not present

## 2020-04-15 DIAGNOSIS — M545 Low back pain, unspecified: Secondary | ICD-10-CM | POA: Diagnosis not present

## 2020-04-15 DIAGNOSIS — M51369 Other intervertebral disc degeneration, lumbar region without mention of lumbar back pain or lower extremity pain: Secondary | ICD-10-CM

## 2020-04-15 DIAGNOSIS — G8929 Other chronic pain: Secondary | ICD-10-CM | POA: Diagnosis not present

## 2020-04-15 DIAGNOSIS — M797 Fibromyalgia: Secondary | ICD-10-CM | POA: Diagnosis not present

## 2020-04-15 MED ORDER — PREGABALIN 200 MG PO CAPS: 200.0000 mg | ORAL_CAPSULE | Freq: Three times a day (TID) | ORAL | 3 refills | Status: DC

## 2020-04-15 MED ORDER — TRAMADOL-ACETAMINOPHEN 37.5-325 MG PO TABS: 1.0000 | ORAL_TABLET | Freq: Three times a day (TID) | ORAL | 0 refills | Status: DC | PRN

## 2020-04-15 NOTE — Assessment & Plan Note (Signed)
She also has midline low back pain, sounds like spinal stenosis, also probably needs an epidural. As above increasing Lyrica, refilling Ultracet, proceeding with MRI and x-ray. We will likely end up ordering an epidural when I see her MRI results. I can plan to see her back in approximately 6 weeks.

## 2020-04-15 NOTE — Assessment & Plan Note (Signed)
This is a pleasant 64 year old female, she has chronic neck and periscapular pain, historically we had gotten some good relief with Lyrica and Ultracet. She also has significant fibromyalgia. At this point we can increase her Lyrica to 200 mg 3 times daily, refill Ultracet, she is currently doing it twice daily as it causes excessive sedation at higher doses. Due to failure of conservative treatment including greater than 6 weeks of physician directed therapy we will also proceed with x-rays, MRI for interventional planning. This will likely take the place of the right C6-C7 interlaminar epidural.

## 2020-04-15 NOTE — Progress Notes (Signed)
    Procedures performed today:    None.  Independent interpretation of notes and tests performed by another provider:   None.  Brief History, Exam, Impression, and Recommendations:    DDD (degenerative disc disease), cervical This is a pleasant 64 year old female, she has chronic neck and periscapular pain, historically we had gotten some good relief with Lyrica and Ultracet. She also has significant fibromyalgia. At this point we can increase her Lyrica to 200 mg 3 times daily, refill Ultracet, she is currently doing it twice daily as it causes excessive sedation at higher doses. Due to failure of conservative treatment including greater than 6 weeks of physician directed therapy we will also proceed with x-rays, MRI for interventional planning. This will likely take the place of the right C6-C7 interlaminar epidural.  DDD (degenerative disc disease), lumbar She also has midline low back pain, sounds like spinal stenosis, also probably needs an epidural. As above increasing Lyrica, refilling Ultracet, proceeding with MRI and x-ray. We will likely end up ordering an epidural when I see her MRI results. I can plan to see her back in approximately 6 weeks.    ___________________________________________ Gwen Her. Dianah Field, M.D., ABFM., CAQSM. Primary Care and [place] Instructor of [place] of [facility] of Medicine

## 2020-04-16 DIAGNOSIS — Z951 Presence of aortocoronary bypass graft: Secondary | ICD-10-CM | POA: Diagnosis not present

## 2020-04-16 DIAGNOSIS — E1065 Type 1 diabetes mellitus with hyperglycemia: Secondary | ICD-10-CM | POA: Diagnosis not present

## 2020-04-16 DIAGNOSIS — E669 Obesity, unspecified: Secondary | ICD-10-CM | POA: Diagnosis not present

## 2020-04-16 DIAGNOSIS — M5136 Other intervertebral disc degeneration, lumbar region: Secondary | ICD-10-CM | POA: Diagnosis not present

## 2020-04-21 ENCOUNTER — Other Ambulatory Visit: Payer: Self-pay

## 2020-04-21 ENCOUNTER — Ambulatory Visit (INDEPENDENT_AMBULATORY_CARE_PROVIDER_SITE_OTHER): Payer: BC Managed Care – PPO

## 2020-04-21 DIAGNOSIS — M545 Low back pain, unspecified: Secondary | ICD-10-CM | POA: Diagnosis not present

## 2020-04-21 DIAGNOSIS — M5412 Radiculopathy, cervical region: Secondary | ICD-10-CM | POA: Diagnosis not present

## 2020-04-21 DIAGNOSIS — M542 Cervicalgia: Secondary | ICD-10-CM | POA: Diagnosis not present

## 2020-04-22 DIAGNOSIS — R600 Localized edema: Secondary | ICD-10-CM | POA: Diagnosis not present

## 2020-04-22 DIAGNOSIS — Z853 Personal history of malignant neoplasm of breast: Secondary | ICD-10-CM | POA: Diagnosis not present

## 2020-04-22 DIAGNOSIS — Z780 Asymptomatic menopausal state: Secondary | ICD-10-CM | POA: Diagnosis not present

## 2020-04-22 DIAGNOSIS — N898 Other specified noninflammatory disorders of vagina: Secondary | ICD-10-CM | POA: Diagnosis not present

## 2020-04-24 DIAGNOSIS — F339 Major depressive disorder, recurrent, unspecified: Secondary | ICD-10-CM | POA: Diagnosis not present

## 2020-04-29 DIAGNOSIS — R635 Abnormal weight gain: Secondary | ICD-10-CM | POA: Diagnosis not present

## 2020-04-29 DIAGNOSIS — R944 Abnormal results of kidney function studies: Secondary | ICD-10-CM | POA: Diagnosis not present

## 2020-04-29 DIAGNOSIS — R600 Localized edema: Secondary | ICD-10-CM | POA: Diagnosis not present

## 2020-04-29 DIAGNOSIS — D649 Anemia, unspecified: Secondary | ICD-10-CM | POA: Diagnosis not present

## 2020-04-29 DIAGNOSIS — R0602 Shortness of breath: Secondary | ICD-10-CM | POA: Diagnosis not present

## 2020-05-01 DIAGNOSIS — R600 Localized edema: Secondary | ICD-10-CM | POA: Diagnosis not present

## 2020-05-03 DIAGNOSIS — G43719 Chronic migraine without aura, intractable, without status migrainosus: Secondary | ICD-10-CM | POA: Diagnosis not present

## 2020-05-06 ENCOUNTER — Other Ambulatory Visit: Payer: Self-pay | Admitting: Sports Medicine

## 2020-05-06 DIAGNOSIS — M797 Fibromyalgia: Secondary | ICD-10-CM

## 2020-05-06 DIAGNOSIS — M5136 Other intervertebral disc degeneration, lumbar region: Secondary | ICD-10-CM

## 2020-05-10 DIAGNOSIS — M85852 Other specified disorders of bone density and structure, left thigh: Secondary | ICD-10-CM | POA: Diagnosis not present

## 2020-05-10 DIAGNOSIS — R609 Edema, unspecified: Secondary | ICD-10-CM | POA: Diagnosis not present

## 2020-05-10 DIAGNOSIS — Z780 Asymptomatic menopausal state: Secondary | ICD-10-CM | POA: Diagnosis not present

## 2020-05-14 DIAGNOSIS — L988 Other specified disorders of the skin and subcutaneous tissue: Secondary | ICD-10-CM | POA: Diagnosis not present

## 2020-05-20 DIAGNOSIS — E039 Hypothyroidism, unspecified: Secondary | ICD-10-CM | POA: Diagnosis not present

## 2020-05-20 DIAGNOSIS — R600 Localized edema: Secondary | ICD-10-CM | POA: Diagnosis not present

## 2020-05-20 DIAGNOSIS — E6609 Other obesity due to excess calories: Secondary | ICD-10-CM | POA: Diagnosis not present

## 2020-05-20 DIAGNOSIS — I10 Essential (primary) hypertension: Secondary | ICD-10-CM | POA: Diagnosis not present

## 2020-05-27 ENCOUNTER — Ambulatory Visit: Payer: BC Managed Care – PPO | Admitting: Sports Medicine

## 2020-05-30 DIAGNOSIS — R635 Abnormal weight gain: Secondary | ICD-10-CM | POA: Diagnosis not present

## 2020-06-04 DIAGNOSIS — F4312 Post-traumatic stress disorder, chronic: Secondary | ICD-10-CM | POA: Diagnosis not present

## 2020-06-19 DIAGNOSIS — Z6833 Body mass index (BMI) 33.0-33.9, adult: Secondary | ICD-10-CM | POA: Diagnosis not present

## 2020-06-19 DIAGNOSIS — I10 Essential (primary) hypertension: Secondary | ICD-10-CM | POA: Diagnosis not present

## 2020-06-19 DIAGNOSIS — Z136 Encounter for screening for cardiovascular disorders: Secondary | ICD-10-CM | POA: Diagnosis not present

## 2020-06-19 DIAGNOSIS — E6609 Other obesity due to excess calories: Secondary | ICD-10-CM | POA: Diagnosis not present

## 2020-06-26 ENCOUNTER — Other Ambulatory Visit: Payer: Self-pay | Admitting: Cardiology

## 2020-08-04 ENCOUNTER — Other Ambulatory Visit: Payer: Self-pay | Admitting: Cardiology

## 2020-08-04 DIAGNOSIS — I25118 Atherosclerotic heart disease of native coronary artery with other forms of angina pectoris: Secondary | ICD-10-CM

## 2020-09-12 ENCOUNTER — Other Ambulatory Visit: Payer: Self-pay | Admitting: Sports Medicine

## 2020-09-12 DIAGNOSIS — M797 Fibromyalgia: Secondary | ICD-10-CM

## 2020-09-12 MED ORDER — DULOXETINE HCL 30 MG PO CPEP: 30.0000 mg | ORAL_CAPSULE | Freq: Every day | ORAL | 3 refills | Status: DC

## 2021-08-26 IMAGING — DX DG HIP (WITH OR WITHOUT PELVIS) 3-4V BILAT
5 series · 5 of 5 positions shown · non-contrast
Comparison: Radiographs 05/28/2016.

CLINICAL DATA: Tailbone and bilateral hip pain since falling from a
ladder 3 days ago.

EXAM:
DG HIP (WITH OR WITHOUT PELVIS) 3-4V BILAT

[pelvis ap]
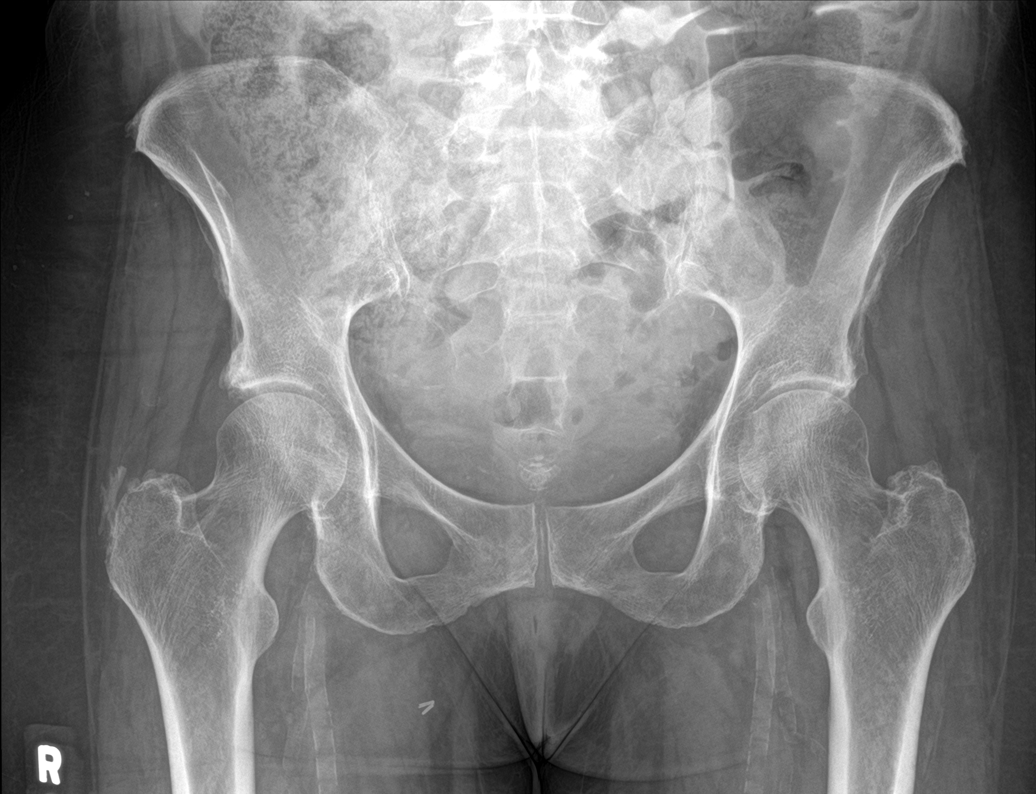

[hip ap (1 of 2)]
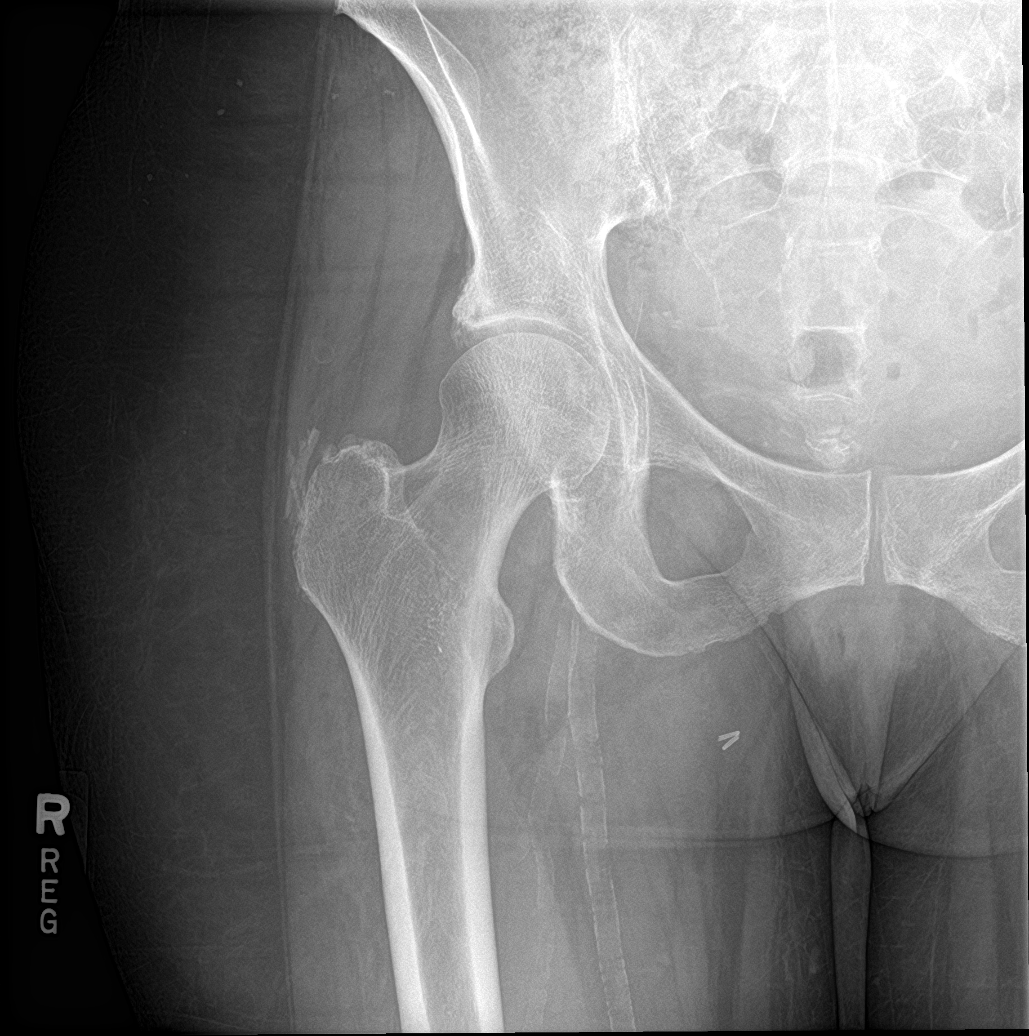

[hip lat (1 of 2)]
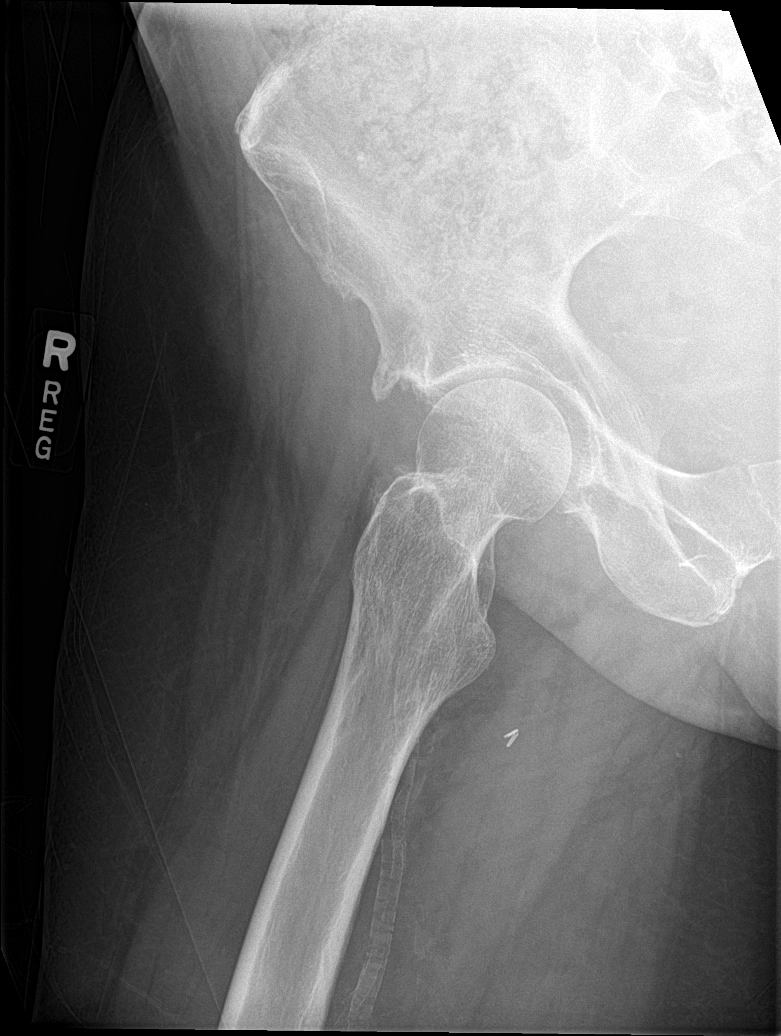

[hip ap (2 of 2)]
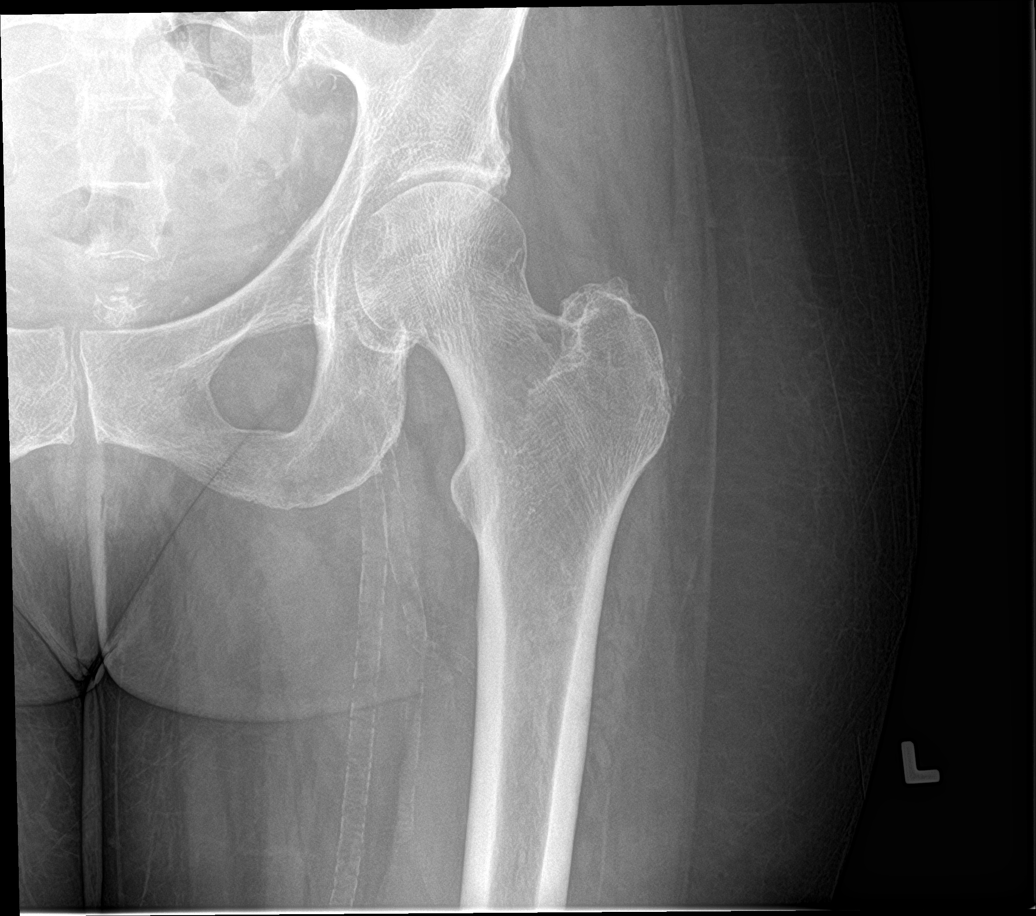

[hip lat (2 of 2)]
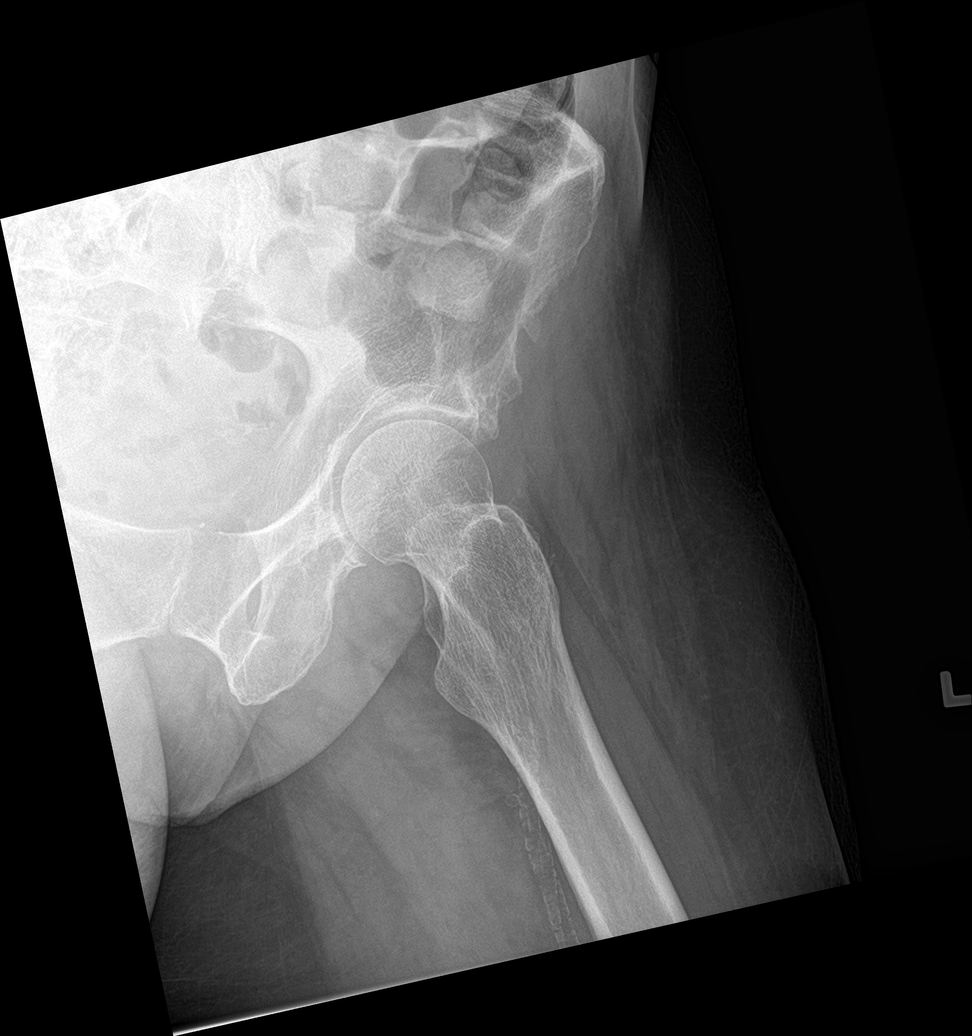

[5 of 5 positions shown; findings below may reference images not displayed]

FINDINGS: The mineralization and alignment are normal. There is no evidence of
acute fracture or dislocation. There are stable mild degenerative
changes of the hips and sacroiliac joints. There is new enthesophyte
formation adjacent to the right greater trochanter. There is diffuse
iliofemoral atherosclerosis bilaterally.
IMPRESSION: New asymmetric enthesophyte formation adjacent to the right greater
trochanter. Stable mild degenerative changes of the hips and
sacroiliac joints. No acute osseous findings.

## 2021-09-23 ENCOUNTER — Other Ambulatory Visit: Payer: Self-pay

## 2022-04-17 ENCOUNTER — Ambulatory Visit (INDEPENDENT_AMBULATORY_CARE_PROVIDER_SITE_OTHER): Payer: No Typology Code available for payment source | Admitting: Sports Medicine

## 2022-04-17 DIAGNOSIS — M5136 Other intervertebral disc degeneration, lumbar region: Secondary | ICD-10-CM

## 2022-04-17 DIAGNOSIS — M51369 Other intervertebral disc degeneration, lumbar region without mention of lumbar back pain or lower extremity pain: Secondary | ICD-10-CM

## 2022-04-17 NOTE — Progress Notes (Signed)
    Procedures performed today:    None.  Independent interpretation of notes and tests performed by another provider:   None.  Brief History, Exam, Impression, and Recommendations:    DDD (degenerative disc disease), lumbar Alejandra Valdez is a 66 year old female, she has chronic axial low back pain, her last MRI from about a year ago showed mostly mild spondylitic changes including disc desiccation, mild facet arthropathy without any central or neuroforaminal stenosis. She has been working with the pain management group with [facility], she has had medial branch blocks, several times, facet injections, no significant improvement. She is taking Lyrica 3 times daily. She is here looking for additional advice. She currently lives in [place]. I have advised her with her MRI findings and history my recommendation would be aggressive physical therapy and aggressive weight loss. She was not interested in physical therapy and she is working with the weight loss provider near where she lives. I did advise her that I really did not have much to offer as she had already failed injections and oral medication treatment. I think she could be a candidate for narcotic pain management, but she would need a pain management provider for this, I will place a referral. She is also interested in hearing the opinion of a neurosurgeon, I really do not see anything surgical in her spine, but I am happy to provide the referral to [facility]. I also advised her that as she lives in [place] she needs to keep most of her care with providers close by and that I really did not have anything else to offer as a primary care sports medicine provider.  I spent 30 minutes of total time managing this patient today, this includes chart review, face to face, and non-face to face time.  ____________________________________________ Gwen Her. Dianah Field, M.D., ABFM., CAQSM., AME. Primary Care and Sports Medicine [facility]  [facility]  Adjunct Professor of [place] of [facility] of Medicine  Risk manager

## 2022-04-17 NOTE — Assessment & Plan Note (Signed)
Alejandra Valdez is a 66 year old female, she has chronic axial low back pain, her last MRI from about a year ago showed mostly mild spondylitic changes including disc desiccation, mild facet arthropathy without any central or neuroforaminal stenosis. She has been working with the pain management group with [facility], she has had medial branch blocks, several times, facet injections, no significant improvement. She is taking Lyrica 3 times daily. She is here looking for additional advice. She currently lives in [place]. I have advised her with her MRI findings and history my recommendation would be aggressive physical therapy and aggressive weight loss. She was not interested in physical therapy and she is working with the weight loss provider near where she lives. I did advise her that I really did not have much to offer as she had already failed injections and oral medication treatment. I think she could be a candidate for narcotic pain management, but she would need a pain management provider for this, I will place a referral. She is also interested in hearing the opinion of a neurosurgeon, I really do not see anything surgical in her spine, but I am happy to provide the referral to [facility]. I also advised her that as she lives in [place] she needs to keep most of her care with providers close by and that I really did not have anything else to offer as a primary care sports medicine provider.

## 2023-01-23 ENCOUNTER — Other Ambulatory Visit: Payer: Self-pay

## 2023-01-23 ENCOUNTER — Encounter (HOSPITAL_COMMUNITY): Payer: Self-pay

## 2023-01-23 ENCOUNTER — Observation Stay (HOSPITAL_COMMUNITY)
Admission: EM | Admit: 2023-01-23 | Discharge: 2023-01-25 | DRG: 919 | Disposition: A | Discharge status: Home / Self Care | Payer: No Typology Code available for payment source | Attending: Internal Medicine | Admitting: Internal Medicine

## 2023-01-23 DIAGNOSIS — E1040 Type 1 diabetes mellitus with diabetic neuropathy, unspecified: Secondary | ICD-10-CM | POA: Diagnosis present

## 2023-01-23 DIAGNOSIS — D649 Anemia, unspecified: Secondary | ICD-10-CM | POA: Diagnosis present

## 2023-01-23 DIAGNOSIS — E861 Hypovolemia: Secondary | ICD-10-CM | POA: Diagnosis present

## 2023-01-23 DIAGNOSIS — Z6832 Body mass index (BMI) 32.0-32.9, adult: Secondary | ICD-10-CM

## 2023-01-23 DIAGNOSIS — E669 Obesity, unspecified: Secondary | ICD-10-CM | POA: Diagnosis present

## 2023-01-23 DIAGNOSIS — Z90710 Acquired absence of both cervix and uterus: Secondary | ICD-10-CM

## 2023-01-23 DIAGNOSIS — K5900 Constipation, unspecified: Secondary | ICD-10-CM | POA: Diagnosis not present

## 2023-01-23 DIAGNOSIS — Z7983 Long term (current) use of bisphosphonates: Secondary | ICD-10-CM

## 2023-01-23 DIAGNOSIS — Z8249 Family history of ischemic heart disease and other diseases of the circulatory system: Secondary | ICD-10-CM

## 2023-01-23 DIAGNOSIS — Z794 Long term (current) use of insulin: Secondary | ICD-10-CM | POA: Diagnosis not present

## 2023-01-23 DIAGNOSIS — G8929 Other chronic pain: Secondary | ICD-10-CM | POA: Diagnosis present

## 2023-01-23 DIAGNOSIS — Z79890 Hormone replacement therapy (postmenopausal): Secondary | ICD-10-CM

## 2023-01-23 DIAGNOSIS — M797 Fibromyalgia: Secondary | ICD-10-CM | POA: Diagnosis not present

## 2023-01-23 DIAGNOSIS — Z833 Family history of diabetes mellitus: Secondary | ICD-10-CM

## 2023-01-23 DIAGNOSIS — M810 Age-related osteoporosis without current pathological fracture: Secondary | ICD-10-CM | POA: Diagnosis present

## 2023-01-23 DIAGNOSIS — E1010 Type 1 diabetes mellitus with ketoacidosis without coma: Principal | ICD-10-CM | POA: Diagnosis present

## 2023-01-23 DIAGNOSIS — E1065 Type 1 diabetes mellitus with hyperglycemia: Secondary | ICD-10-CM

## 2023-01-23 DIAGNOSIS — K5909 Other constipation: Secondary | ICD-10-CM | POA: Diagnosis present

## 2023-01-23 DIAGNOSIS — Z823 Family history of stroke: Secondary | ICD-10-CM

## 2023-01-23 DIAGNOSIS — I2510 Atherosclerotic heart disease of native coronary artery without angina pectoris: Secondary | ICD-10-CM | POA: Diagnosis present

## 2023-01-23 DIAGNOSIS — T85694A Other mechanical complication of insulin pump, initial encounter: Secondary | ICD-10-CM | POA: Diagnosis present

## 2023-01-23 DIAGNOSIS — Z7952 Long term (current) use of systemic steroids: Secondary | ICD-10-CM | POA: Diagnosis not present

## 2023-01-23 DIAGNOSIS — Z818 Family history of other mental and behavioral disorders: Secondary | ICD-10-CM

## 2023-01-23 DIAGNOSIS — K219 Gastro-esophageal reflux disease without esophagitis: Secondary | ICD-10-CM | POA: Diagnosis present

## 2023-01-23 DIAGNOSIS — R739 Hyperglycemia, unspecified: Secondary | ICD-10-CM | POA: Diagnosis not present

## 2023-01-23 DIAGNOSIS — I252 Old myocardial infarction: Secondary | ICD-10-CM

## 2023-01-23 DIAGNOSIS — E109 Type 1 diabetes mellitus without complications: Secondary | ICD-10-CM | POA: Diagnosis present

## 2023-01-23 DIAGNOSIS — F419 Anxiety disorder, unspecified: Secondary | ICD-10-CM | POA: Diagnosis present

## 2023-01-23 DIAGNOSIS — Z8719 Personal history of other diseases of the digestive system: Secondary | ICD-10-CM

## 2023-01-23 DIAGNOSIS — E039 Hypothyroidism, unspecified: Secondary | ICD-10-CM | POA: Diagnosis present

## 2023-01-23 DIAGNOSIS — Z951 Presence of aortocoronary bypass graft: Secondary | ICD-10-CM

## 2023-01-23 DIAGNOSIS — E871 Hypo-osmolality and hyponatremia: Secondary | ICD-10-CM | POA: Diagnosis not present

## 2023-01-23 DIAGNOSIS — F32A Depression, unspecified: Secondary | ICD-10-CM | POA: Insufficient documentation

## 2023-01-23 DIAGNOSIS — Z7982 Long term (current) use of aspirin: Secondary | ICD-10-CM

## 2023-01-23 DIAGNOSIS — Z811 Family history of alcohol abuse and dependence: Secondary | ICD-10-CM

## 2023-01-23 DIAGNOSIS — T383X6A Underdosing of insulin and oral hypoglycemic [antidiabetic] drugs, initial encounter: Secondary | ICD-10-CM | POA: Diagnosis present

## 2023-01-23 DIAGNOSIS — Z853 Personal history of malignant neoplasm of breast: Secondary | ICD-10-CM

## 2023-01-23 DIAGNOSIS — E785 Hyperlipidemia, unspecified: Secondary | ICD-10-CM | POA: Diagnosis not present

## 2023-01-23 DIAGNOSIS — Z9013 Acquired absence of bilateral breasts and nipples: Secondary | ICD-10-CM

## 2023-01-23 DIAGNOSIS — I10 Essential (primary) hypertension: Secondary | ICD-10-CM | POA: Diagnosis present

## 2023-01-23 DIAGNOSIS — Y742 Prosthetic and other implants, materials and accessory general hospital and personal-use devices associated with adverse incidents: Secondary | ICD-10-CM | POA: Diagnosis present

## 2023-01-23 DIAGNOSIS — Z79899 Other long term (current) drug therapy: Secondary | ICD-10-CM

## 2023-01-23 DIAGNOSIS — Z885 Allergy status to narcotic agent status: Secondary | ICD-10-CM

## 2023-01-23 DIAGNOSIS — Z83438 Family history of other disorder of lipoprotein metabolism and other lipidemia: Secondary | ICD-10-CM

## 2023-01-23 LAB — LIPASE, BLOOD: Lipase: 24 U/L (ref 11–51)

## 2023-01-23 LAB — URINALYSIS, ROUTINE W REFLEX MICROSCOPIC
Bilirubin Urine: NEGATIVE
Glucose, UA: >=500 mg/dL — AB
Hgb urine dipstick: NEGATIVE
Ketones, ur: 80 mg/dL — AB
Leukocytes,Ua: NEGATIVE
Nitrite: NEGATIVE
Protein, ur: NEGATIVE mg/dL
Specific Gravity, Urine: 1.024 (ref 1.005–1.030)
pH: 5 (ref 5.0–8.0)

## 2023-01-23 LAB — CBC
HCT: 37.2 % (ref 36.0–46.0)
Hemoglobin: 11.8 g/dL — ABNORMAL LOW (ref 12.0–15.0)
MCH: 30.5 pg (ref 26.0–34.0)
MCHC: 31.7 g/dL (ref 30.0–36.0)
MCV: 96.1 fL (ref 80.0–100.0)
Platelets: 239 10*3/uL (ref 150–400)
RBC: 3.87 MIL/uL (ref 3.87–5.11)
RDW: 13.4 % (ref 11.5–15.5)
WBC: 8 10*3/uL (ref 4.0–10.5)
nRBC: 0 % (ref 0.0–0.2)

## 2023-01-23 LAB — COMPREHENSIVE METABOLIC PANEL
ALT: 24 U/L (ref 0–44)
AST: 18 U/L (ref 15–41)
Albumin: 3.7 g/dL (ref 3.5–5.0)
Alkaline Phosphatase: 115 U/L (ref 38–126)
Anion gap: 20 — ABNORMAL HIGH (ref 5–15)
BUN: 33 mg/dL — ABNORMAL HIGH (ref 8–23)
CO2: 17 mmol/L — ABNORMAL LOW (ref 22–32)
Calcium: 9.1 mg/dL (ref 8.9–10.3)
Chloride: 98 mmol/L (ref 98–111)
Creatinine, Ser: 1 mg/dL (ref 0.44–1.00)
GFR, Estimated: >60 mL/min (ref >60)
Glucose, Bld: 578 mg/dL (ref 70–99)
Potassium: 5 mmol/L (ref 3.5–5.1)
Sodium: 135 mmol/L (ref 135–145)
Total Bilirubin: 1.4 mg/dL — ABNORMAL HIGH (ref 0.3–1.2)
Total Protein: 6.8 g/dL (ref 6.5–8.1)

## 2023-01-23 LAB — BLOOD GAS, VENOUS
Acid-base deficit: 8.7 mmol/L — ABNORMAL HIGH (ref 0.0–2.0)
Bicarbonate: 16.6 mmol/L — ABNORMAL LOW (ref 20.0–28.0)
O2 Saturation: 72.5 %
Patient temperature: 37
pCO2, Ven: 33 mmHg — ABNORMAL LOW (ref 44–60)
pH, Ven: 7.31 (ref 7.25–7.43)
pO2, Ven: 41 mmHg (ref 32–45)

## 2023-01-23 LAB — CBC WITH DIFFERENTIAL/PLATELET
Abs Immature Granulocytes: 0.01 10*3/uL (ref 0.00–0.07)
Basophils Absolute: 0.1 10*3/uL (ref 0.0–0.1)
Basophils Relative: 1 %
Eosinophils Absolute: 0 10*3/uL (ref 0.0–0.5)
Eosinophils Relative: 0 %
HCT: 40.9 % (ref 36.0–46.0)
Hemoglobin: 13.2 g/dL (ref 12.0–15.0)
Immature Granulocytes: 0 %
Lymphocytes Relative: 8 %
Lymphs Abs: 0.7 10*3/uL (ref 0.7–4.0)
MCH: 31.1 pg (ref 26.0–34.0)
MCHC: 32.3 g/dL (ref 30.0–36.0)
MCV: 96.5 fL (ref 80.0–100.0)
Monocytes Absolute: 0.3 10*3/uL (ref 0.1–1.0)
Monocytes Relative: 3 %
Neutro Abs: 8.2 10*3/uL — ABNORMAL HIGH (ref 1.7–7.7)
Neutrophils Relative %: 88 %
Platelets: 251 10*3/uL (ref 150–400)
RBC: 4.24 MIL/uL (ref 3.87–5.11)
RDW: 13.1 % (ref 11.5–15.5)
WBC: 9.3 10*3/uL (ref 4.0–10.5)
nRBC: 0 % (ref 0.0–0.2)

## 2023-01-23 LAB — CREATININE, SERUM
Creatinine, Ser: 0.81 mg/dL (ref 0.44–1.00)
GFR, Estimated: >60 mL/min (ref >60)

## 2023-01-23 LAB — CBG MONITORING, ED
Glucose-Capillary: 520 mg/dL (ref 70–99)
Glucose-Capillary: 433 mg/dL — ABNORMAL HIGH (ref 70–99)
Glucose-Capillary: 383 mg/dL — ABNORMAL HIGH (ref 70–99)
Glucose-Capillary: 255 mg/dL — ABNORMAL HIGH (ref 70–99)
Glucose-Capillary: 172 mg/dL — ABNORMAL HIGH (ref 70–99)

## 2023-01-23 MED ORDER — INSULIN ASPART 100 UNIT/ML IJ SOLN
4.0000 [IU] | Freq: Three times a day (TID) | INTRAMUSCULAR | Status: DC
  Filled 2023-01-23: qty 0.04

## 2023-01-23 MED ORDER — LIOTHYRONINE SODIUM 5 MCG PO TABS
2.5000 ug | ORAL_TABLET | Freq: Every day | ORAL | Status: DC
  Administered 2023-01-24 – 2023-01-25 (×2): 2.5 ug via ORAL
  Filled 2023-01-23 (×2): qty 1

## 2023-01-23 MED ORDER — GABAPENTIN 300 MG PO CAPS
900.0000 mg | ORAL_CAPSULE | Freq: Every day | ORAL | Status: DC
  Administered 2023-01-23 – 2023-01-24 (×2): 900 mg via ORAL
  Filled 2023-01-23 (×2): qty 3

## 2023-01-23 MED ORDER — ASPIRIN 81 MG PO TBEC
81.0000 mg | DELAYED_RELEASE_TABLET | Freq: Every day | ORAL | Status: DC
  Administered 2023-01-23 – 2023-01-25 (×3): 81 mg via ORAL
  Filled 2023-01-23 (×3): qty 1

## 2023-01-23 MED ORDER — CYCLOSPORINE 0.05 % OP EMUL
1.0000 [drp] | Freq: Two times a day (BID) | OPHTHALMIC | Status: DC
  Administered 2023-01-23 – 2023-01-25 (×4): 1 [drp] via OPHTHALMIC
  Filled 2023-01-23 (×4): qty 30

## 2023-01-23 MED ORDER — ONDANSETRON HCL 4 MG PO TABS: 4.0000 mg | ORAL_TABLET | Freq: Four times a day (QID) | ORAL | Status: DC | PRN

## 2023-01-23 MED ORDER — PANTOPRAZOLE SODIUM 40 MG PO TBEC
40.0000 mg | DELAYED_RELEASE_TABLET | Freq: Every day | ORAL | Status: DC
  Administered 2023-01-23 – 2023-01-25 (×3): 40 mg via ORAL
  Filled 2023-01-23 (×3): qty 1

## 2023-01-23 MED ORDER — METOPROLOL SUCCINATE ER 50 MG PO TB24
100.0000 mg | ORAL_TABLET | Freq: Every day | ORAL | Status: DC
  Administered 2023-01-23 – 2023-01-25 (×3): 100 mg via ORAL
  Filled 2023-01-23 (×2): qty 2
  Filled 2023-01-23: qty 4

## 2023-01-23 MED ORDER — SODIUM CHLORIDE 0.9 % IV BOLUS
1000.0000 mL | Freq: Once | INTRAVENOUS | Status: AC
  Administered 2023-01-23: 1000 mL via INTRAVENOUS

## 2023-01-23 MED ORDER — LEVOTHYROXINE SODIUM 112 MCG PO TABS
112.0000 ug | ORAL_TABLET | Freq: Every day | ORAL | Status: DC
  Administered 2023-01-24 – 2023-01-25 (×2): 112 ug via ORAL
  Filled 2023-01-23 (×2): qty 1

## 2023-01-23 MED ORDER — ONDANSETRON HCL 4 MG/2ML IJ SOLN
4.0000 mg | Freq: Four times a day (QID) | INTRAMUSCULAR | Status: DC | PRN
  Administered 2023-01-23: 4 mg via INTRAVENOUS
  Filled 2023-01-23: qty 2

## 2023-01-23 MED ORDER — DEXTROSE 50 % IV SOLN: 0.0000 mL | INTRAVENOUS | Status: DC | PRN

## 2023-01-23 MED ORDER — ATORVASTATIN CALCIUM 40 MG PO TABS
40.0000 mg | ORAL_TABLET | Freq: Every day | ORAL | Status: DC
  Administered 2023-01-23 – 2023-01-24 (×2): 40 mg via ORAL
  Filled 2023-01-23 (×2): qty 1

## 2023-01-23 MED ORDER — LACTATED RINGERS IV SOLN: INTRAVENOUS | Status: DC

## 2023-01-23 MED ORDER — LINACLOTIDE 72 MCG PO CAPS
72.0000 ug | ORAL_CAPSULE | Freq: Every day | ORAL | Status: DC
  Filled 2023-01-23 (×2): qty 1

## 2023-01-23 MED ORDER — INSULIN REGULAR(HUMAN) IN NACL 100-0.9 UT/100ML-% IV SOLN
INTRAVENOUS | Status: DC
  Administered 2023-01-23: 10.5 [IU]/h via INTRAVENOUS
  Filled 2023-01-23: qty 100

## 2023-01-23 MED ORDER — FLUOXETINE HCL 20 MG PO CAPS
40.0000 mg | ORAL_CAPSULE | Freq: Every day | ORAL | Status: DC
  Administered 2023-01-23 – 2023-01-25 (×3): 40 mg via ORAL
  Filled 2023-01-23 (×3): qty 2

## 2023-01-23 MED ORDER — DEXTROSE IN LACTATED RINGERS 5 % IV SOLN: INTRAVENOUS | Status: DC

## 2023-01-23 MED ORDER — LAMOTRIGINE 100 MG PO TABS: 100.0000 mg | ORAL_TABLET | Freq: Every day | ORAL | Status: DC

## 2023-01-23 MED ORDER — INSULIN DETEMIR 100 UNIT/ML ~~LOC~~ SOLN
15.0000 [IU] | Freq: Every day | SUBCUTANEOUS | Status: DC
  Administered 2023-01-23 – 2023-01-24 (×2): 15 [IU] via SUBCUTANEOUS
  Filled 2023-01-23 (×2): qty 0.15

## 2023-01-23 MED ORDER — EZETIMIBE 10 MG PO TABS
10.0000 mg | ORAL_TABLET | Freq: Every day | ORAL | Status: DC
  Administered 2023-01-24 – 2023-01-25 (×2): 10 mg via ORAL
  Filled 2023-01-23 (×3): qty 1

## 2023-01-23 MED ORDER — ACETAMINOPHEN 650 MG RE SUPP: 650.0000 mg | Freq: Four times a day (QID) | RECTAL | Status: DC | PRN

## 2023-01-23 MED ORDER — LAMOTRIGINE 100 MG PO TABS
200.0000 mg | ORAL_TABLET | Freq: Every day | ORAL | Status: DC
  Administered 2023-01-23 – 2023-01-25 (×3): 200 mg via ORAL
  Filled 2023-01-23 (×3): qty 2

## 2023-01-23 MED ORDER — ACETAMINOPHEN 325 MG PO TABS: 650.0000 mg | ORAL_TABLET | Freq: Four times a day (QID) | ORAL | Status: DC | PRN

## 2023-01-23 MED ORDER — ONDANSETRON HCL 4 MG/2ML IJ SOLN
4.0000 mg | Freq: Once | INTRAMUSCULAR | Status: AC
  Administered 2023-01-23: 4 mg via INTRAVENOUS
  Filled 2023-01-23: qty 2

## 2023-01-23 MED ORDER — ENOXAPARIN SODIUM 40 MG/0.4ML IJ SOSY
40.0000 mg | PREFILLED_SYRINGE | INTRAMUSCULAR | Status: DC
  Administered 2023-01-23 – 2023-01-24 (×2): 40 mg via SUBCUTANEOUS
  Filled 2023-01-23 (×2): qty 0.4

## 2023-01-23 MED ORDER — INSULIN ASPART 100 UNIT/ML IJ SOLN
0.0000 [IU] | INTRAMUSCULAR | Status: DC
  Administered 2023-01-24: 8 [IU] via SUBCUTANEOUS
  Filled 2023-01-23: qty 0.24

## 2023-01-23 NOTE — ED Triage Notes (Signed)
PT BIB [facility] for hyperglycemia. Pt was out with family when she noted her blood glucose was high. Pt gave 5 herself 5 units of insulin prior to EMS. EMS report a CBG of 574. Pt has associated nausea. EMS admin 250 mL of NaCl and 4 mg of Zofran.

## 2023-01-23 NOTE — ED Provider Notes (Signed)
[place] EMERGENCY DEPARTMENT AT [facility] Provider Note   CSN: [id number] Arrival date & time: 01/23/23  1600     History  Chief Complaint  Patient presents with   Hyperglycemia    Alejandra Valdez is a 67 y.o. female history of type 1 diabetes on insulin pump, STEMI, breast cancer presented with hyperglycemia.  Patient states that she changed out her insulin pump yesterday but does not think that it is working.  Patient states that she feels overall fatigued with generalized stomach ache and that she is nauseous but has not had any episodes of emesis.  Patient denies any sick contacts, fevers, chest pain, shortness of breath, changes sensation/motor skills, dysuria.  Patient is unsure if she has had history of DKA in the past.    Home Medications Prior to Admission medications   Medication Sig Start Date End Date Taking? Authorizing Provider  Acetone, Urine, Test (KETONE TEST) STRP by Does not apply route. 07/20/16   [provider]  alendronate (FOSAMAX) 70 MG tablet Take 1 tablet (70 mg total) by mouth every 7 (seven) days. Take with a full glass of water on an empty stomach. 08/09/18   Agapito Games, MD  ARIPiprazole (ABILIFY) 10 MG tablet Take by mouth daily.    [provider]  aspirin EC 81 MG tablet Take 1 tablet (81 mg total) by mouth daily. 11/18/16   Strader, Lennart Pall, PA-C  BAYER CONTOUR NEXT TEST test strip 150 each as needed. 03/23/15   [provider]  Botulinum Toxin Type A 200 units SOLR Inject as directed. Once every 3 months for migraines    [provider]  celecoxib (CELEBREX) 200 MG capsule Take 1 capsule (200 mg total) by mouth daily. *NO ADDITIONAL REFILLS. PLEASE CALL THE OFFICE TO SCHEDULE AN APPOINTMENT* 11/28/15   Agapito Games, MD  cholecalciferol (VITAMIN D3) 25 MCG (1000 UT) tablet Take 1,000 Units by mouth daily.    [provider]  clonazePAM (KLONOPIN) 1 MG tablet Take 1 mg by mouth 2  (two) times daily.    [provider]  cycloSPORINE (RESTASIS) 0.05 % ophthalmic emulsion Place 1 drop into both eyes 2 (two) times daily. Patient taking differently: Place 1 drop into both eyes daily. 04/17/15   Agapito Games, MD  DULoxetine (CYMBALTA) 30 MG capsule Take 1 capsule (30 mg total) by mouth daily. 09/12/20   Monica Becton, MD  ezetimibe (ZETIA) 10 MG tablet TAKE 1 TABLET BY MOUTH DAILY 06/26/20   Yates Decamp, MD  fludrocortisone (FLORINEF) 0.1 MG tablet TAKE 1 TABLET BY MOUTH IN THE [place] AFTER DINNER 07/24/19   Toniann Fail, NP  ibandronate (BONIVA) 150 MG tablet TAKE 1 TABLET BY MOUTH EVERY 30 DAYS 04/03/19   Agapito Games, MD  insulin aspart (NOVOLOG) 100 UNIT/ML injection Use up to 75 units daily by insulin pump. 09/09/17   Agapito Games, MD  lactulose (CHRONULAC) 10 GM/15ML solution TAKE 30 ML BY MOUTH TWICE DAILY 07/10/19   [provider]  lamoTRIgine (LAMICTAL) 100 MG tablet Take 1 tablet (100 mg total) daily by mouth. 05/13/17   Thresa Ross, MD  levothyroxine (SYNTHROID) 125 MCG tablet Take 1 tablet (125 mcg total) by mouth 2 (two) times a week. Then take the the other 5 days of the week. 11/14/18   Agapito Games, MD  metoprolol succinate (TOPROL-XL) 25 MG 24 hr tablet TAKE 1 TABLET BY MOUTH DAILY 07/03/19   Yates Decamp,  MD  Na Sulfate-K Sulfate-Mg Sulf 17.5-3.13-1.6 GM/177ML SOLN At 6 PM day before procedure, complete steps 1-4 using one 6 ounce bottle.  At 5 AM the day of procedure, repeat steps 1-4 using 2nd bottle. 07/06/19   [provider]  naratriptan (AMERGE) 2.5 MG tablet Take 2.5 mg by mouth. Take one tablet at onset of migraine do not take more than 1 a day 02/08/15   [provider]  nitroGLYCERIN (NITROSTAT) 0.4 MG SL tablet DISSOLVE 1 TABLET UNDER THE TONGUE EVERY 5 MINUTES FOR UP TO 3 DOSES AS NEEDED FOR CHEST PAIN 02/12/20   Yates Decamp, MD  nortriptyline (PAMELOR) 10 MG capsule  TAKE 1 CAPSULE(10 MG) BY MOUTH THREE TIMES DAILY. FOLLOW UP APPOINTMENT BEFORE MORE REFILLS Patient taking differently: TAKE 1 CAPSULE(10 MG) BY MOUTH at bedtime 08/12/15   Agapito Games, MD  omeprazole (PRILOSEC) 40 MG capsule TAKE ONE CAPSULE BY MOUTH EVERY DAY 11/14/18   Agapito Games, MD  pregabalin (LYRICA) 200 MG capsule Take 1 capsule (200 mg total) by mouth 3 (three) times daily. 04/15/20   Monica Becton, MD  simvastatin (ZOCOR) 40 MG tablet TAKE 1 TABLET BY MOUTH DAILY AT 6 PM**SCHEDULE APPT FOR FUTURE REFILLS** 09/02/18   Lewayne Bunting, MD  topiramate ER (QUDEXY XR) 150 MG CS24 sprinkle capsule Take by mouth daily.    [provider]  traMADol-acetaminophen (ULTRACET) 37.5-325 MG tablet TAKE 1 TO 2 TABLETS BY MOUTH EVERY 8 HOURS AS NEEDED FOR SEVERE PAIN 05/07/20   Monica Becton, MD  traZODone (DESYREL) 100 MG tablet Take 2 tablets (200 mg total) by mouth at bedtime. Patient taking differently: Take 100 mg by mouth at bedtime. 05/18/17   Agapito Games, MD  zolpidem (AMBIEN CR) 6.25 MG CR tablet Take 1 tablet by mouth at bedtime as needed.  01/23/19   [provider]  temazepam (RESTORIL) 15 MG capsule TK ONE C PO QHS 08/12/15 05/13/17  [provider]      Allergies    Hydrocodone-acetaminophen and Vicodin [hydrocodone-acetaminophen]    Review of Systems   Review of Systems  Physical Exam Updated Vital Signs BP (!) 127/58 (BP Location: Right Arm)   Pulse 98   Temp 97.9 F (36.6 C) (Oral)   Resp (!) 22   Ht 5\' 7"  (1.702 m)   Wt 94.3 kg   SpO2 100%   BMI 32.58 kg/m  Physical Exam Vitals reviewed.  Constitutional:      General: She is not in acute distress. HENT:     Head: Normocephalic and atraumatic.  Eyes:     Extraocular Movements: Extraocular movements intact.     Conjunctiva/sclera: Conjunctivae normal.     Pupils: Pupils are equal, round, and reactive to light.  Cardiovascular:     Rate and  Rhythm: Normal rate and regular rhythm.     Pulses: Normal pulses.     Heart sounds: Normal heart sounds.     Comments: 2+ bilateral radial/dorsalis pedis pulses with regular rate Pulmonary:     Effort: Pulmonary effort is normal. No respiratory distress.     Breath sounds: Normal breath sounds.  Abdominal:     Palpations: Abdomen is soft.     Tenderness: There is no abdominal tenderness. There is no guarding or rebound.  Musculoskeletal:        General: Normal range of motion.     Cervical back: Normal range of motion and neck supple.     Comments: 5 out  of 5 bilateral grip/leg extension strength  Skin:    General: Skin is warm and dry.     Capillary Refill: Capillary refill takes less than 2 seconds.  Neurological:     General: No focal deficit present.     Mental Status: She is alert and oriented to person, place, and time.     Comments: Sensation intact in all 4 limbs  Psychiatric:        Mood and Affect: Mood normal.     ED Results / Procedures / Treatments   Labs (all labs ordered are listed, but only abnormal results are displayed) Labs Reviewed  CBC WITH DIFFERENTIAL/PLATELET - Abnormal; Notable for the following components:      Result Value   Neutro Abs 8.2 (*)    All other components within normal limits  COMPREHENSIVE METABOLIC PANEL - Abnormal; Notable for the following components:   CO2 17 (*)    Glucose, Bld 578 (*)    BUN 33 (*)    Total Bilirubin 1.4 (*)    Anion gap 20 (*)    All other components within normal limits  URINALYSIS, ROUTINE W REFLEX MICROSCOPIC - Abnormal; Notable for the following components:   Color, Urine STRAW (*)    Glucose, UA >=500 (*)    Ketones, ur 80 (*)    Bacteria, UA RARE (*)    All other components within normal limits  BLOOD GAS, VENOUS - Abnormal; Notable for the following components:   pCO2, Ven 33 (*)    Bicarbonate 16.6 (*)    Acid-base deficit 8.7 (*)    All other components within normal limits  CBG MONITORING,  ED - Abnormal; Notable for the following components:   Glucose-Capillary 520 (*)    All other components within normal limits  LIPASE, BLOOD  I-STAT CG4 LACTIC ACID, ED    EKG None  Radiology No results found.  Procedures Procedures    Medications Ordered in ED Medications  insulin regular, human (MYXREDLIN) 100 units/ 100 mL infusion (has no administration in time range)  lactated ringers infusion (has no administration in time range)  dextrose 5 % in lactated ringers infusion (has no administration in time range)  dextrose 50 % solution 0-50 mL (has no administration in time range)  sodium chloride 0.9 % bolus 1,000 mL (1,000 mLs Intravenous New Bag/Given 01/23/23 1638)  ondansetron (ZOFRAN) injection 4 mg (4 mg Intravenous Given 01/23/23 1822)    ED Course/ Medical Decision Making/ A&P                             Medical Decision Making Amount and/or Complexity of Data Reviewed Labs: ordered.   Mayli Hana 67 y.o. presented today for hyperglycemia. Working DDx that I considered at this time includes, but not limited to, hyperglycemic state, HHS, DKA, electrolyte abnormalities, anemia, acidosis.  R/o DDx: HHS, electrolyte abnormalities, anemia: These are considered less likely due to history of present illness and physical exam findings  Review of prior external notes: 04/17/2022 progress Notes  Unique Tests and My Interpretation:  CBC: Unremarkable CMP: Leukos 578, BUN 33, anion gap 20  CBG: 520 Lipase: Negative UA: Greater than 500 glucose VBG: Unremarkable  Discussion with Independent Historian: None  Discussion of Management of Tests:  Lula Olszewski, MD Hospitalist  Risk: High: hospitalization or escalation of hospital-level care  Risk Stratification Score: None  Staffed with Tegeler, MD  Plan: On exam patient was in no  acute distress stable vitals.  Patient feels exam is largely unremarkable however patient CBG in the room was 520.  Patient states that  she did just change out her insulin pump and does not believe it is working and feels that her sugar is high and that is was causing her symptoms.  Fluids were ordered along with Zofran as patient was nauseous with abdominal labs.  Patient stable at this time.  Patient has anion gap with ketonuria with pH 7.31 and glucose 578 indicative of DKA. Patient placed on insulin and fluids and will be admitted to the hospital.  Patient stable for admission at this time.  I spoke to the hospitalist and patient was accepted for admission.         Final Clinical Impression(s) / ED Diagnoses Final diagnoses:  Diabetic ketoacidosis without coma associated with type 1 diabetes mellitus [facility])    Rx / DC Orders ED Discharge Orders     None         Remi Deter 01/23/23 1830    Tegeler, Canary Brim, MD 01/23/23 2224

## 2023-01-23 NOTE — ED Notes (Addendum)
Pt states still very dizzy while ambulating to the br.

## 2023-01-23 NOTE — ED Notes (Signed)
IVF are slow running. Insulin drip delayed until first liter of fluids are infused.

## 2023-01-23 NOTE — ED Notes (Signed)
..ED TO INPATIENT HANDOFF REPORT  Name/Age/Gender Alejandra Valdez 67 y.o. female  Code Status    Code Status Orders  (From admission, onward)           Start     Ordered   01/23/23 2013  Full code  Continuous       Question:  By:  Answer:  Consent: discussion documented in EHR   01/23/23 2013           Code Status History     This patient has a current code status but no historical code status.      Advance Directive Documentation    Flowsheet Row Most Recent Value  Type of Advance Directive Healthcare Power of Attorney, Living will  Pre-existing out of facility DNR order (yellow form or pink MOST form) --  "MOST" Form in Place? --       Home/SNF/Other Home  Chief Complaint Type 1 diabetes (HCC) [E10.9]  Level of Care/Admitting Diagnosis ED Disposition     ED Disposition  Admit   Condition  --   Comment  Hospital Area: [facility] [[id number]]  Level of Care: Stepdown [14]  Admit to SDU based on following criteria: Severe physiological/psychological symptoms:  Any diagnosis requiring assessment & intervention at least every 4 hours on an ongoing basis to obtain desired patient outcomes including stability and rehabilitation  May admit patient to [facility] or [facility] if equivalent level of care is available:: Yes  Covid Evaluation: Asymptomatic - no recent exposure (last 10 days) testing not required  Diagnosis: Type 1 diabetes [facility]) [[id number]]  Admitting Physician: Alan Mulder [[id number]]  Attending Physician: Alan Mulder [phone]  Certification:: I certify this patient will need inpatient services for at least 2 midnights  Estimated Length of Stay: 3          Medical History Past Medical History:  Diagnosis Date   ADHD (attention deficit hyperactivity disorder)    Anxiety    Arthritis    Of spine DDD/DJD   CAD (coronary artery disease)    a. s/p CABG in 2013 with LIMA-LAD, SVG-RCA, and SVG-OM1-D2   Cancer (HCC)  2012   Breast   Depression    Diabetes mellitus without complication (HCC)    H/O bilateral breast implants 01/24/2016   History of colon polyps    Hypothyroid    Lung nodule    Stable per CT 05/2013   Migraine    Myocardial infarction [facility]) 2013   Neuropathy    Osteoporosis     Allergies Allergies  Allergen Reactions   Vicodin [Hydrocodone-Acetaminophen] Other (See Comments)    Prefers to not be prescribed this medication    IV Location/Drains/Wounds Patient Lines/Drains/Airways Status     Active Line/Drains/Airways     Name Placement date Placement time Site Days   Peripheral IV 01/23/23 22 G Left Antecubital 01/23/23  1611  Antecubital  less than 1            Labs/Imaging Results for orders placed or performed during the hospital encounter of 01/23/23 (from the past 48 hour(s))  CBG monitoring, ED     Status: Abnormal   Collection Time: 01/23/23  4:14 PM  Result Value Ref Range   Glucose-Capillary 520 (HH) 70 - 99 mg/dL    Comment: Glucose reference range applies only to samples taken after fasting for at least 8 hours.   Comment 1 Notify RN   Urinalysis, Routine w reflex microscopic -Urine, Clean Catch  Status: Abnormal   Collection Time: 01/23/23  4:18 PM  Result Value Ref Range   Color, Urine STRAW (A) YELLOW   APPearance CLEAR CLEAR   Specific Gravity, Urine 1.024 1.005 - 1.030   pH 5.0 5.0 - 8.0   Glucose, UA >=500 (A) NEGATIVE mg/dL   Hgb urine dipstick NEGATIVE NEGATIVE   Bilirubin Urine NEGATIVE NEGATIVE   Ketones, ur 80 (A) NEGATIVE mg/dL   Protein, ur NEGATIVE NEGATIVE mg/dL   Nitrite NEGATIVE NEGATIVE   Leukocytes,Ua NEGATIVE NEGATIVE   RBC / HPF 0-5 0 - 5 RBC/hpf   WBC, UA 6-10 0 - 5 WBC/hpf   Bacteria, UA RARE (A) NONE SEEN   Squamous Epithelial / HPF 0-5 0 - 5 /HPF    Comment: Performed at [facility], [address] [place], [place] [address]  CBC with Differential     Status: Abnormal   Collection Time: 01/23/23   4:32 PM  Result Value Ref Range   WBC 9.3 4.0 - 10.5 K/uL   RBC 4.24 3.87 - 5.11 MIL/uL   Hemoglobin 13.2 12.0 - 15.0 g/dL   HCT 96.2 95.2 - 84.1 %   MCV 96.5 80.0 - 100.0 fL   MCH 31.1 26.0 - 34.0 pg   MCHC 32.3 30.0 - 36.0 g/dL   RDW 32.4 40.1 - 02.7 %   Platelets 251 150 - 400 K/uL   nRBC 0.0 0.0 - 0.2 %   Neutrophils Relative % 88 %   Neutro Abs 8.2 (H) 1.7 - 7.7 K/uL   Lymphocytes Relative 8 %   Lymphs Abs 0.7 0.7 - 4.0 K/uL   Monocytes Relative 3 %   Monocytes Absolute 0.3 0.1 - 1.0 K/uL   Eosinophils Relative 0 %   Eosinophils Absolute 0.0 0.0 - 0.5 K/uL   Basophils Relative 1 %   Basophils Absolute 0.1 0.0 - 0.1 K/uL   Immature Granulocytes 0 %   Abs Immature Granulocytes 0.01 0.00 - 0.07 K/uL    Comment: Performed at [facility], [address], [place], [place] [address]  Comprehensive metabolic panel     Status: Abnormal   Collection Time: 01/23/23  4:32 PM  Result Value Ref Range   Sodium 135 135 - 145 mmol/L   Potassium 5.0 3.5 - 5.1 mmol/L   Chloride 98 98 - 111 mmol/L   CO2 17 (L) 22 - 32 mmol/L   Glucose, Bld 578 (HH) 70 - 99 mg/dL    Comment: CRITICAL RESULT CALLED TO, READ BACK BY AND VERIFIED WITH KISER, C RN @ 1722 ON 01/23/2023 BY Deedra Ehrich, K Glucose reference range applies only to samples taken after fasting for at least 8 hours.    BUN 33 (H) 8 - 23 mg/dL   Creatinine, Ser 4.40 0.44 - 1.00 mg/dL   Calcium 9.1 8.9 - 34.7 mg/dL   Total Protein 6.8 6.5 - 8.1 g/dL   Albumin 3.7 3.5 - 5.0 g/dL   AST 18 15 - 41 U/L   ALT 24 0 - 44 U/L   Alkaline Phosphatase 115 38 - 126 U/L   Total Bilirubin 1.4 (H) 0.3 - 1.2 mg/dL   GFR, Estimated >42 >59 mL/min    Comment: (NOTE) Calculated using the CKD-EPI Creatinine Equation (2021)    Anion gap 20 (H) 5 - 15    Comment: Performed at [facility], [address] [place], [place] [address]  Lipase, blood     Status: None   Collection  Time: 01/23/23  4:32 PM  Result Value  Ref Range   Lipase 24 11 - 51 U/L    Comment: Performed at [facility], [address], [place], [place] [address]  Blood gas, venous     Status: Abnormal   Collection Time: 01/23/23  4:39 PM  Result Value Ref Range   pH, Ven 7.31 7.25 - 7.43   pCO2, Ven 33 (L) 44 - 60 mmHg   pO2, Ven 41 32 - 45 mmHg   Bicarbonate 16.6 (L) 20.0 - 28.0 mmol/L   Acid-base deficit 8.7 (H) 0.0 - 2.0 mmol/L   O2 Saturation 72.5 %   Patient temperature 37.0     Comment: Performed at [facility], [address], [place], [place] [address]  CBG monitoring, ED     Status: Abnormal   Collection Time: 01/23/23  8:02 PM  Result Value Ref Range   Glucose-Capillary 433 (H) 70 - 99 mg/dL    Comment: Glucose reference range applies only to samples taken after fasting for at least 8 hours.  CBG monitoring, ED     Status: Abnormal   Collection Time: 01/23/23  8:43 PM  Result Value Ref Range   Glucose-Capillary 383 (H) 70 - 99 mg/dL    Comment: Glucose reference range applies only to samples taken after fasting for at least 8 hours.  CBG monitoring, ED     Status: Abnormal   Collection Time: 01/23/23  9:58 PM  Result Value Ref Range   Glucose-Capillary 255 (H) 70 - 99 mg/dL    Comment: Glucose reference range applies only to samples taken after fasting for at least 8 hours.  CBG monitoring, ED     Status: Abnormal   Collection Time: 01/23/23 11:01 PM  Result Value Ref Range   Glucose-Capillary 172 (H) 70 - 99 mg/dL    Comment: Glucose reference range applies only to samples taken after fasting for at least 8 hours.   No results found.  Pending Labs Unresulted Labs (From admission, onward)     Start     Ordered   01/30/23 0500  Creatinine, serum  (enoxaparin (LOVENOX)    CrCl >/= 30 ml/min)  Weekly,   R     Comments: while on enoxaparin therapy    01/23/23 2013   01/24/23 0500  Hemoglobin A1c  Tomorrow morning,   R        01/23/23 2013   01/24/23 0500  Basic  metabolic panel  Tomorrow morning,   R        01/23/23 2013   01/23/23 2012  HIV Antibody (routine testing w rflx)  (HIV Antibody (Routine testing w reflex) panel)  Once,   R        01/23/23 2013   01/23/23 2012  CBC  (enoxaparin (LOVENOX)    CrCl >/= 30 ml/min)  Once,   R       Comments: Baseline for enoxaparin therapy IF NOT ALREADY DRAWN.  Notify MD if PLT < 100 K.    01/23/23 2013   01/23/23 2012  Creatinine, serum  (enoxaparin (LOVENOX)    CrCl >/= 30 ml/min)  Once,   R       Comments: Baseline for enoxaparin therapy IF NOT ALREADY DRAWN.    01/23/23 2013            Vitals/Pain Today's Vitals   01/23/23 1615 01/23/23 1800 01/23/23 2034 01/23/23 2109  BP:  (!) 158/81  (!) 131/53  Pulse:  Marland Kitchen)  108  (!) 109  Resp:  (!) 21  20  Temp:   98.1 F (36.7 C)   TempSrc:   Oral   SpO2:  100%  97%  Weight: 94.3 kg     Height: 5\' 7"  (1.702 m)     PainSc: 0-No pain       Isolation Precautions No active isolations  Medications Medications  insulin regular, human (MYXREDLIN) 100 units/ 100 mL infusion (0 Units/hr Intravenous Stopped 01/23/23 2257)  lactated ringers infusion ( Intravenous Stopped 01/23/23 2310)  dextrose 5 % in lactated ringers infusion ( Intravenous New Bag/Given 01/23/23 2312)  dextrose 50 % solution 0-50 mL (has no administration in time range)  aspirin EC tablet 81 mg (81 mg Oral Given 01/23/23 2204)  atorvastatin (LIPITOR) tablet 40 mg (40 mg Oral Given 01/23/23 2203)  ezetimibe (ZETIA) tablet 10 mg (has no administration in time range)  metoprolol succinate (TOPROL-XL) 24 hr tablet 100 mg (100 mg Oral Given 01/23/23 2204)  FLUoxetine (PROZAC) capsule 40 mg (40 mg Oral Given 01/23/23 2203)  levothyroxine (SYNTHROID) tablet 112 mcg (has no administration in time range)  liothyronine (CYTOMEL) tablet 2.5 mcg (has no administration in time range)  linaclotide (LINZESS) capsule 72 mcg (has no administration in time range)  pantoprazole (PROTONIX) EC tablet 40 mg (40 mg  Oral Given 01/23/23 2204)  gabapentin (NEURONTIN) capsule 900 mg (900 mg Oral Given 01/23/23 2204)  lamoTRIgine (LAMICTAL) tablet 200 mg (200 mg Oral Given 01/23/23 2203)  cycloSPORINE (RESTASIS) 0.05 % ophthalmic emulsion 1 drop (1 drop Both Eyes Given 01/23/23 2313)  enoxaparin (LOVENOX) injection 40 mg (40 mg Subcutaneous Given 01/23/23 2204)  acetaminophen (TYLENOL) tablet 650 mg (has no administration in time range)    Or  acetaminophen (TYLENOL) suppository 650 mg (has no administration in time range)  ondansetron (ZOFRAN) tablet 4 mg ( Oral See Alternative 01/23/23 2100)    Or  ondansetron (ZOFRAN) injection 4 mg (4 mg Intravenous Given 01/23/23 2100)  insulin detemir (LEVEMIR) injection 15 Units (has no administration in time range)  sodium chloride 0.9 % bolus 1,000 mL (0 mLs Intravenous Stopped 01/23/23 2030)  ondansetron (ZOFRAN) injection 4 mg (4 mg Intravenous Given 01/23/23 1822)    Mobility walks with person assist\

## 2023-01-23 NOTE — H&P (Signed)
History and Physical    Alejandra Valdez ZOX:[id number] DOB: Jun 04, 1956 DOA: 01/23/2023  PCP: Ciro Backer, FNP   Chief Complaint: hyperglycemia  HPI: Alejandra Valdez is a 67 y.o. female with medical history significant of   anxiety, depression, diabetes, hypothyroidism who presented to the Emergency department due to hyperglycemia.  Patient is a type I diabetic and has an insulin pump.  She changed from yesterday and did think it was working correctly.  She felt worsening fatigue and tiredness so she presented to the ER due to concern of developing DKA as she has had issues with it before.  In the emergency and labs were obtained which revealed WBC 9.3, hemoglobin 13.2, sodium 135, glucose 578, bicarb 17, anion gap 20, pH 7.31.  Patient was started on insulin drip and admitted for further management.  On admission her glucose gradually decreased.  She did not have diabetic ketoacidosis as there is no acidosis present.  Review of Systems: Review of Systems  Constitutional:  Negative for chills and fever.  HENT:  Negative for hearing loss.   Cardiovascular:  Negative for chest pain and orthopnea.  Neurological:  Negative for dizziness.     As per HPI otherwise 10 point review of systems negative.   Allergies  Allergen Reactions   Vicodin [Hydrocodone-Acetaminophen] Other (See Comments)    Prefers to not be prescribed this medication    Past Medical History:  Diagnosis Date   ADHD (attention deficit hyperactivity disorder)    Anxiety    Arthritis    Of spine DDD/DJD   CAD (coronary artery disease)    a. s/p CABG in 2013 with LIMA-LAD, SVG-RCA, and SVG-OM1-D2   Cancer (HCC) 2012   Breast   Depression    Diabetes mellitus without complication (HCC)    H/O bilateral breast implants 01/24/2016   History of colon polyps    Hypothyroid    Lung nodule    Stable per CT 05/2013   Migraine    Myocardial infarction [facility]) 2013   Neuropathy    Osteoporosis     Past Surgical History:   Procedure Laterality Date   ABDOMINAL HYSTERECTOMY     has left ovary   APPENDECTOMY     BILATERAL CARPAL TUNNEL RELEASE     BLEPHAROPLASTY Bilateral    CATARACT EXTRACTION Bilateral    CESAREAN SECTION     x2   CORONARY ARTERY BYPASS GRAFT  2013   x4   MASTECTOMY Bilateral 2012   OTHER SURGICAL HISTORY     Retinal repair   SHOULDER SURGERY Bilateral      reports that she has never smoked. She has never used smokeless tobacco. She reports that she does not drink alcohol and does not use drugs.  Family History  Problem Relation Age of Onset   Depression Mother    Alcohol abuse Father    Hyperlipidemia Father    Hypertension Father    Stroke Father    Alcohol abuse Brother    Depression Brother    Hyperlipidemia Brother    Hypertension Brother    Cancer Maternal Grandmother    Heart attack Maternal Grandmother    Diabetes Maternal Grandfather    Alcohol abuse Brother    Depression Brother    Hyperlipidemia Brother    Hypertension Brother     Prior to Admission medications   Medication Sig Start Date End Date Taking? Authorizing Provider  AIMOVIG 140 MG/ML SOAJ Inject 140 mg into the skin every 28 (twenty-eight) days.  Yes [provider]  aspirin EC 81 MG tablet Take 1 tablet (81 mg total) by mouth daily. 11/18/16  Yes Strader, [place] M, PA-C  atorvastatin (LIPITOR) 40 MG tablet Take 40 mg by mouth at bedtime.   Yes [provider]  baclofen (LIORESAL) 10 MG tablet Take 10 mg by mouth daily as needed for muscle spasms.   Yes [provider]  Botulinum Toxin Type A 200 units SOLR Inject as directed See admin instructions. Once every 3 months for migraines   Yes [provider]  cholecalciferol (VITAMIN D3) 25 MCG (1000 UT) tablet Take 1,000 Units by mouth daily.   Yes [provider]  Continuous Glucose Sensor (DEXCOM G6 SENSOR) MISC Inject 1 Device into the skin See admin instructions. Place 1 new sensor into the skin every  10 days   Yes [provider]  diclofenac (VOLTAREN) 50 MG EC tablet Take 50 mg by mouth daily.   Yes [provider]  ezetimibe (ZETIA) 10 MG tablet TAKE 1 TABLET BY MOUTH DAILY Patient taking differently: Take 10 mg by mouth daily. 06/26/20  Yes Yates Decamp, MD  FLUoxetine (PROZAC) 40 MG capsule Take 40 mg by mouth daily.   Yes [provider]  gabapentin (NEURONTIN) 300 MG capsule Take 900 mg by mouth at bedtime.   Yes [provider]  ibandronate (BONIVA) 150 MG tablet TAKE 1 TABLET BY MOUTH EVERY 30 DAYS Patient taking differently: Take 150 mg by mouth every 30 (thirty) days. 04/03/19  Yes Agapito Games, MD  Insulin Aspart, w/Niacinamide, (FIASP) 100 UNIT/ML SOLN Inject as directed See admin instructions. For use in pump (Omnipod 5 G-6)   Yes [provider]  Insulin Disposable Pump (OMNIPOD 5 G6 PODS, GEN 5,) MISC Inject 1 Device into the skin every 3 (three) days.   Yes [provider]  lactulose (CHRONULAC) 10 GM/15ML solution Take 10 g by mouth daily as needed for mild constipation. 07/10/19  Yes [provider]  lamoTRIgine (LAMICTAL) 200 MG tablet Take 200 mg by mouth daily.   Yes [provider]  levothyroxine (SYNTHROID) 112 MCG tablet Take 112 mcg by mouth daily before breakfast.   Yes [provider]  linaclotide (LINZESS) 72 MCG capsule Take 72 mcg by mouth at bedtime.   Yes [provider]  liothyronine (CYTOMEL) 5 MCG tablet Take 2.5 mcg by mouth daily before breakfast.   Yes [provider]  LUMIFY 0.025 % SOLN Place 1 drop into both eyes 2 (two) times daily.   Yes [provider]  metoprolol succinate (TOPROL-XL) 100 MG 24 hr tablet Take 100 mg by mouth daily. Take with or immediately following a meal.   Yes [provider]  Multiple Vitamins-Minerals (ALIVE CALCIUM BONE SUPPORT) TABS Take 1-2 tablets by mouth daily.   Yes [provider]   nitroGLYCERIN (NITROSTAT) 0.4 MG SL tablet DISSOLVE 1 TABLET UNDER THE TONGUE EVERY 5 MINUTES FOR UP TO 3 DOSES AS NEEDED FOR CHEST PAIN Patient taking differently: Place 0.4 mg under the tongue every 5 (five) minutes x 3 doses as needed for chest pain. 02/12/20  Yes Yates Decamp, MD  omeprazole (PRILOSEC) 40 MG capsule TAKE ONE CAPSULE BY MOUTH EVERY DAY Patient taking differently: Take 40 mg by mouth daily before breakfast. 11/14/18  Yes Agapito Games, MD  promethazine (PHENERGAN) 25 MG tablet Take 25 mg by mouth every 8 (eight) hours as needed for nausea or vomiting.   Yes [provider]  SPRAVATO, 84 MG DOSE, 28 MG/DEVICE SOPK Place 1 spray into both nostrils See admin instructions. Instill 1 spray into each nostril (or as otherwise directed) on Wednesdays and Fridays   Yes [provider]  tretinoin (RETIN-A) 0.1 % cream Apply 1 application  topically at bedtime.   Yes [provider]  UBRELVY 100 MG TABS Take 100 mg by mouth See admin instructions. Take 100 mg by mouth as directed for a migraine. May take an additional 100 mg as needed, if no relief after 2 hours.   Yes [provider]  zaleplon (SONATA) 10 MG capsule Take 10 mg by mouth at bedtime.   Yes [provider]  Acetone, Urine, Test (KETONE TEST) STRP by Does not apply route. 07/20/16   [provider]  alendronate (FOSAMAX) 70 MG tablet Take 1 tablet (70 mg total) by mouth every 7 (seven) days. Take with a full glass of water on an empty stomach. Patient not taking: Reported on 01/23/2023 08/09/18   Agapito Games, MD  BAYER CONTOUR NEXT TEST test strip 150 each as needed. 03/23/15   [provider]  celecoxib (CELEBREX) 200 MG capsule Take 1 capsule (200 mg total) by mouth daily. *NO ADDITIONAL REFILLS. PLEASE CALL THE OFFICE TO SCHEDULE AN APPOINTMENT* Patient not taking: Reported on 01/23/2023 11/28/15   Agapito Games, MD  cycloSPORINE (RESTASIS) 0.05 %  ophthalmic emulsion Place 1 drop into both eyes 2 (two) times daily. Patient not taking: Reported on 01/23/2023 04/17/15   Agapito Games, MD  DULoxetine (CYMBALTA) 30 MG capsule Take 1 capsule (30 mg total) by mouth daily. Patient not taking: Reported on 01/23/2023 09/12/20   Monica Becton, MD  fludrocortisone (FLORINEF) 0.1 MG tablet TAKE 1 TABLET BY MOUTH IN THE EVENING AFTER DINNER Patient not taking: Reported on 01/23/2023 07/24/19   Toniann Fail, NP  insulin aspart (NOVOLOG) 100 UNIT/ML injection Use up to 75 units daily by insulin pump. Patient not taking: Reported on 01/23/2023 09/09/17   Agapito Games, MD  lamoTRIgine (LAMICTAL) 100 MG tablet Take 1 tablet (100 mg total) daily by mouth. Patient not taking: Reported on 01/23/2023 05/13/17   Thresa Ross, MD  levothyroxine (SYNTHROID) 125 MCG tablet Take 1 tablet (125 mcg total) by mouth 2 (two) times a week. Then take the the other 5 days of the week. Patient not taking: Reported on 01/23/2023 11/14/18   Agapito Games, MD  metoprolol succinate (TOPROL-XL) 25 MG 24 hr tablet TAKE 1 TABLET BY MOUTH DAILY Patient not taking: Reported on 01/23/2023 07/03/19   Yates Decamp, MD  nortriptyline (PAMELOR) 10 MG capsule TAKE 1 CAPSULE(10 MG) BY MOUTH THREE TIMES DAILY. FOLLOW UP APPOINTMENT BEFORE MORE REFILLS Patient not taking: Reported on 01/23/2023 08/12/15   Agapito Games, MD  pregabalin (LYRICA) 200 MG capsule Take 1 capsule (200 mg total) by mouth 3 (three) times daily. Patient not taking: Reported on 01/23/2023 04/15/20   Monica Becton, MD  simvastatin (ZOCOR) 40 MG tablet TAKE 1 TABLET BY MOUTH DAILY AT 6 PM**SCHEDULE APPT FOR FUTURE REFILLS** Patient not taking: Reported on 01/23/2023 09/02/18   Lewayne Bunting, MD  traMADol-acetaminophen (ULTRACET) 37.5-325 MG tablet TAKE 1 TO 2 TABLETS BY MOUTH EVERY 8 HOURS AS NEEDED FOR SEVERE PAIN Patient not taking: Reported on 01/23/2023 05/07/20    Monica Becton, MD  traZODone (DESYREL) 100 MG tablet Take 2 tablets (200 mg total) by mouth at bedtime. Patient not taking: Reported on  01/23/2023 05/18/17   Agapito Games, MD  temazepam (RESTORIL) 15 MG capsule TK ONE C PO QHS 08/12/15 05/13/17  [provider]    Physical Exam: Vitals:   01/23/23 1614 01/23/23 1615  BP: (!) 127/58   Pulse: 98   Resp: (!) 22   Temp: 97.9 F (36.6 C)   TempSrc: Oral   SpO2: 100%   Weight:  94.3 kg  Height:  5\' 7"  (1.702 m)   Physical Exam Vitals reviewed.  Constitutional:      Appearance: She is obese.  HENT:     Head: Normocephalic.     Nose: Nose normal.     Mouth/Throat:     Mouth: Mucous membranes are moist.  Eyes:     Conjunctiva/sclera: Conjunctivae normal.     Pupils: Pupils are equal, round, and reactive to light.  Cardiovascular:     Rate and Rhythm: Normal rate and regular rhythm.     Pulses: Normal pulses.     Heart sounds: Normal heart sounds.  Pulmonary:     Effort: Pulmonary effort is normal.     Breath sounds: Normal breath sounds.  Abdominal:     General: Abdomen is flat. Bowel sounds are normal.  Musculoskeletal:        General: Normal range of motion.     Cervical back: Normal range of motion.  Skin:    General: Skin is warm.     Capillary Refill: Capillary refill takes less than 2 seconds.  Neurological:     General: No focal deficit present.     Mental Status: She is alert.  Psychiatric:        Mood and Affect: Mood normal.       Labs on Admission: I have personally reviewed the patients's labs and imaging studies.  Assessment/Plan Principal Problem:   Type 1 diabetes (HCC)   # Hyperglycemia-patient presented due to insulin pump failure found to have glucose of 578.  Was started on insulin drip and IV fluids.  Will closely monitor and hopefully range off of drip this evening.  Her total daily insulin requirement is 28 units per her pump.  Will start her on 15 units of Levemir and  4 units with meals as well as sliding scale every 4 hours  # Hyperlipidemia-continue Lipitor, Zetia  # Depression-continue Prozac  # Chronic pain-continue gabapentin  # Mood disorder-continue lamotrigine  # Hypertension-continue metoprolol  # GERD-continue pantoprazole  # Chronic constipation-continue Linzess  # Hypothyroidism-continue thyroid supplements    Admission status: Inpatient Stepdown  Certification: The appropriate patient status for this patient is INPATIENT. Inpatient status is judged to be reasonable and necessary in order to provide the required intensity of service to ensure the patient's safety. The patient's presenting symptoms, physical exam findings, and initial radiographic and laboratory data in the context of their chronic comorbidities is felt to place them at high risk for further clinical deterioration. Furthermore, it is not anticipated that the patient will be medically stable for discharge from the hospital within 2 midnights of admission.   * I certify that at the point of admission it is my clinical judgment that the patient will require inpatient hospital care spanning beyond 2 midnights from the point of admission due to high intensity of service, high risk for further deterioration and high frequency of surveillance required.Alan Mulder MD [facility] If 7PM-7AM, please contact night-coverage [email or web address]  01/23/2023, 8:14 PM

## 2023-01-24 DIAGNOSIS — K5909 Other constipation: Secondary | ICD-10-CM | POA: Diagnosis not present

## 2023-01-24 DIAGNOSIS — E1010 Type 1 diabetes mellitus with ketoacidosis without coma: Secondary | ICD-10-CM

## 2023-01-24 LAB — BASIC METABOLIC PANEL
Anion gap: 7 (ref 5–15)
Anion gap: 8 (ref 5–15)
BUN: 23 mg/dL (ref 8–23)
BUN: 20 mg/dL (ref 8–23)
CO2: 26 mmol/L (ref 22–32)
CO2: 25 mmol/L (ref 22–32)
Calcium: 8.2 mg/dL — ABNORMAL LOW (ref 8.9–10.3)
Calcium: 8.7 mg/dL — ABNORMAL LOW (ref 8.9–10.3)
Chloride: 102 mmol/L (ref 98–111)
Chloride: 103 mmol/L (ref 98–111)
Creatinine, Ser: 0.75 mg/dL (ref 0.44–1.00)
Creatinine, Ser: 0.7 mg/dL (ref 0.44–1.00)
GFR, Estimated: >60 mL/min (ref >60)
GFR, Estimated: >60 mL/min (ref >60)
Glucose, Bld: 74 mg/dL (ref 70–99)
Glucose, Bld: 134 mg/dL — ABNORMAL HIGH (ref 70–99)
Potassium: 3.7 mmol/L (ref 3.5–5.1)
Potassium: 3.6 mmol/L (ref 3.5–5.1)
Sodium: 135 mmol/L (ref 135–145)
Sodium: 136 mmol/L (ref 135–145)

## 2023-01-24 LAB — GLUCOSE, CAPILLARY
Glucose-Capillary: 125 mg/dL — ABNORMAL HIGH (ref 70–99)
Glucose-Capillary: 142 mg/dL — ABNORMAL HIGH (ref 70–99)
Glucose-Capillary: 166 mg/dL — ABNORMAL HIGH (ref 70–99)
Glucose-Capillary: 173 mg/dL — ABNORMAL HIGH (ref 70–99)
Glucose-Capillary: 226 mg/dL — ABNORMAL HIGH (ref 70–99)
Glucose-Capillary: 82 mg/dL (ref 70–99)
Glucose-Capillary: 91 mg/dL (ref 70–99)

## 2023-01-24 LAB — HIV ANTIBODY (ROUTINE TESTING W REFLEX): HIV Screen 4th Generation wRfx: NONREACTIVE

## 2023-01-24 LAB — BASIC METABOLIC PANEL WITH GFR
Anion gap: 12 (ref 5–15)
BUN: 25 mg/dL — ABNORMAL HIGH (ref 8–23)
CO2: 18 mmol/L — ABNORMAL LOW (ref 22–32)
Calcium: 8.1 mg/dL — ABNORMAL LOW (ref 8.9–10.3)
Chloride: 104 mmol/L (ref 98–111)
Creatinine, Ser: 0.76 mg/dL (ref 0.44–1.00)
GFR, Estimated: >60 mL/min (ref >60)
Glucose, Bld: 204 mg/dL — ABNORMAL HIGH (ref 70–99)
Potassium: 4.1 mmol/L (ref 3.5–5.1)
Sodium: 134 mmol/L — ABNORMAL LOW (ref 135–145)

## 2023-01-24 LAB — MRSA NEXT GEN BY PCR, NASAL: MRSA by PCR Next Gen: NOT DETECTED

## 2023-01-24 MED ORDER — INSULIN DETEMIR 100 UNIT/ML ~~LOC~~ SOLN
7.0000 [IU] | Freq: Two times a day (BID) | SUBCUTANEOUS | Status: DC
  Administered 2023-01-25: 7 [IU] via SUBCUTANEOUS
  Filled 2023-01-24 (×2): qty 0.07

## 2023-01-24 MED ORDER — POLYVINYL ALCOHOL 1.4 % OP SOLN
1.0000 [drp] | OPHTHALMIC | Status: DC | PRN
  Filled 2023-01-24: qty 15

## 2023-01-24 MED ORDER — ORAL CARE MOUTH RINSE: 15.0000 mL | OROMUCOSAL | Status: DC | PRN

## 2023-01-24 MED ORDER — INSULIN ASPART 100 UNIT/ML IJ SOLN
4.0000 [IU] | Freq: Three times a day (TID) | INTRAMUSCULAR | Status: DC
  Administered 2023-01-24 – 2023-01-25 (×3): 4 [IU] via SUBCUTANEOUS

## 2023-01-24 MED ORDER — INSULIN ASPART 100 UNIT/ML IJ SOLN: 0.0000 [IU] | Freq: Three times a day (TID) | INTRAMUSCULAR | Status: DC

## 2023-01-24 MED ORDER — MELATONIN 5 MG PO TABS
5.0000 mg | ORAL_TABLET | Freq: Every evening | ORAL | Status: DC | PRN
  Filled 2023-01-24: qty 1

## 2023-01-24 MED ORDER — LACTATED RINGERS IV SOLN: INTRAVENOUS | Status: DC

## 2023-01-24 MED ORDER — INSULIN ASPART 100 UNIT/ML IJ SOLN: 0.0000 [IU] | Freq: Every day | INTRAMUSCULAR | Status: DC

## 2023-01-24 MED ORDER — SODIUM CHLORIDE 0.9 % IV SOLN: INTRAVENOUS | Status: AC

## 2023-01-24 MED ORDER — CHLORHEXIDINE GLUCONATE CLOTH 2 % EX PADS
6.0000 | MEDICATED_PAD | Freq: Every day | CUTANEOUS | Status: DC
  Administered 2023-01-24: 6 via TOPICAL

## 2023-01-24 MED ORDER — INSULIN ASPART 100 UNIT/ML IJ SOLN: 4.0000 [IU] | Freq: Three times a day (TID) | INTRAMUSCULAR | Status: DC

## 2023-01-24 MED ORDER — CHLORHEXIDINE GLUCONATE CLOTH 2 % EX PADS: 6.0000 | MEDICATED_PAD | Freq: Every day | CUTANEOUS | Status: DC

## 2023-01-24 MED ORDER — PROCHLORPERAZINE EDISYLATE 10 MG/2ML IJ SOLN
5.0000 mg | Freq: Once | INTRAMUSCULAR | Status: AC
  Administered 2023-01-24: 5 mg via INTRAVENOUS
  Filled 2023-01-24: qty 2

## 2023-01-24 NOTE — Progress Notes (Addendum)
Hypoglycemic Event  CBG: 64  Treatment: 4 oz juice/soda  Symptoms: None  Follow-up CBG: Time: 1140 CBG Result: 82  Possible Reasons for Event: Unknown  Comments/MD notified:Feliz Joylene Igo, Susane Bey L

## 2023-01-24 NOTE — Progress Notes (Signed)
[facility] PROGRESS NOTE    Progress Note  Alejandra Valdez  NWG:[id number] DOB: 03/27/1956 DOA: 01/23/2023 PCP: Ciro Backer, FNP     Brief Narrative:   Alejandra Valdez is an 67 y.o. female past medical history seen for anxiety depression, diabetes mellitus type 1 on an insulin pump, hypothyroidism comes into the emergency room DKA   Assessment/Plan:   DKA, type 1 (HCC)/in a type I diabetic: Secondarily to insulin pump failure. Was started on IV insulin. Was taken off the IV insulin drip started on long-acting insulin.  Started on sliding scale insulin she does not have the equipment for insulin pump Will go ahead and start on aggressive IV fluids, check a basic metabolic panel every 4 hours. Anion gap is 12, but her bicarb is still 18. Continue monitor strict I's and O's Daily weights.  Hypovolemic hyponatremia: Allow oral hydration continue IV fluids recheck basic metabolic panel.  Normocytic anemia: No signs of overt bleeding follow-up with PCP as an outpatient.  Hyperlipidemia: Continue statin and Setia.  Depression: Continue Prozac.  Fibromyalgia/chronic pain: Continue gabapentin.  Mood disorder: Continue lamotrigine.  Essential hypertension: Continue Toprol.  GERD: Continue PPI.  Chronic constipation: Continue Linzess.  Hypothyroidism: Continue Synthroid.  S/P CABG x 4 Continue aspirin and statins.  DVT prophylaxis: lovenox Family Communication:husband Status is: Inpatient Remains inpatient appropriate because: DKA    Code Status:     Code Status Orders  (From admission, onward)           Start     Ordered   01/23/23 2013  Full code  Continuous       Question:  By:  Answer:  Consent: discussion documented in EHR   01/23/23 2013           Code Status History     This patient has a current code status but no historical code status.      Advance Directive Documentation    Flowsheet Row Most Recent Value  Type of  Advance Directive Healthcare Power of Attorney  Pre-existing out of facility DNR order (yellow form or pink MOST form) --  "MOST" Form in Place? --         IV Access:   Peripheral IV   Procedures and diagnostic studies:   No results found.   Medical Consultants:   None.   Subjective:    Alejandra Valdez   Objective:    Vitals:   01/24/23 0034 01/24/23 0055 01/24/23 0200 01/24/23 0341  BP: (!) 97/42 (!) 112/35 (!) 104/36   Pulse: 85 84 80   Resp: (!) 21 17 17    Temp: 99 F (37.2 C)   98.1 F (36.7 C)  TempSrc: Oral   Oral  SpO2: 100% 98% 100%   Weight: 93.8 kg     Height: 5\' 7"  (1.702 m)      SpO2: 100 %   Intake/Output Summary (Last 24 hours) at 01/24/2023 0706 Last data filed at 01/24/2023 5784 Gross per 24 hour  Intake 2001.59 ml  Output --  Net 2001.59 ml   Filed Weights   01/23/23 1615 01/24/23 0034  Weight: 94.3 kg 93.8 kg    Exam: General exam: In no acute distress. Respiratory system: Good air movement and clear to auscultation. Cardiovascular system: S1 & S2 heard, RRR.  Gastrointestinal system: Abdomen is nondistended, soft and nontender.  Extremities: No pedal edema. Skin: No rashes, lesions or ulcers Psychiatry: Judgement and insight appear normal. Mood & affect appropriate.    Data  Reviewed:    Labs: Basic Metabolic Panel: Recent Labs  Lab 01/23/23 1632 01/23/23 2256 01/24/23 0253  NA 135  --  134*  K 5.0  --  4.1  CL 98  --  104  CO2 17*  --  18*  GLUCOSE 578*  --  204*  BUN 33*  --  25*  CREATININE 1.00 0.81 0.76  CALCIUM 9.1  --  8.1*   GFR Estimated Creatinine Clearance: 83.6 mL/min (by C-G formula based on SCr of 0.76 mg/dL). Liver Function Tests: Recent Labs  Lab 01/23/23 1632  AST 18  ALT 24  ALKPHOS 115  BILITOT 1.4*  PROT 6.8  ALBUMIN 3.7   Recent Labs  Lab 01/23/23 1632  LIPASE 24   No results for input(s): "AMMONIA" in the last 168 hours. Coagulation profile No results for input(s): "INR",  "PROTIME" in the last 168 hours. COVID-19 Labs  No results for input(s): "DDIMER", "FERRITIN", "LDH", "CRP" in the last 72 hours.  No results found for: "SARSCOV2NAA"  CBC: Recent Labs  Lab 01/23/23 1632 01/23/23 2256  WBC 9.3 8.0  NEUTROABS 8.2*  --   HGB 13.2 11.8*  HCT 40.9 37.2  MCV 96.5 96.1  PLT 251 239   Cardiac Enzymes: No results for input(s): "CKTOTAL", "CKMB", "CKMBINDEX", "TROPONINI" in the last 168 hours. BNP (last 3 results) No results for input(s): "PROBNP" in the last 8760 hours. CBG: Recent Labs  Lab 01/23/23 2158 01/23/23 2301 01/24/23 0028 01/24/23 0138 01/24/23 0335  GLUCAP 255* 172* 166* 142* 226*   D-Dimer: No results for input(s): "DDIMER" in the last 72 hours. Hgb A1c: No results for input(s): "HGBA1C" in the last 72 hours. Lipid Profile: No results for input(s): "CHOL", "HDL", "LDLCALC", "TRIG", "CHOLHDL", "LDLDIRECT" in the last 72 hours. Thyroid function studies: No results for input(s): "TSH", "T4TOTAL", "T3FREE", "THYROIDAB" in the last 72 hours.  Invalid input(s): "FREET3" Anemia work up: No results for input(s): "VITAMINB12", "FOLATE", "FERRITIN", "TIBC", "IRON", "RETICCTPCT" in the last 72 hours. Sepsis Labs: Recent Labs  Lab 01/23/23 1632 01/23/23 2256  WBC 9.3 8.0   Microbiology Recent Results (from the past 240 hour(s))  MRSA Next Gen by PCR, Nasal     Status: None   Collection Time: 01/24/23  1:22 AM   Specimen: Nasal Mucosa; Nasal Swab  Result Value Ref Range Status   MRSA by PCR Next Gen NOT DETECTED NOT DETECTED Final    Comment: (NOTE) The GeneXpert MRSA Assay (FDA approved for NASAL specimens only), is one component of a comprehensive MRSA colonization surveillance program. It is not intended to diagnose MRSA infection nor to guide or monitor treatment for MRSA infections. Test performance is not FDA approved in patients less than 15 years old. Performed at [facility], [address] [place], [place] [address]      Medications:    aspirin EC  81 mg Oral Daily   atorvastatin  40 mg Oral QHS   Chlorhexidine Gluconate Cloth  6 each Topical Daily   cycloSPORINE  1 drop Both Eyes BID   enoxaparin (LOVENOX) injection  40 mg Subcutaneous Q24H   ezetimibe  10 mg Oral Daily   FLUoxetine  40 mg Oral Daily   gabapentin  900 mg Oral QHS   insulin aspart  0-24 Units Subcutaneous Q4H   insulin aspart  4 Units Subcutaneous TID WC   insulin detemir  15 Units Subcutaneous Daily   lamoTRIgine  200 mg Oral Daily   levothyroxine  112 mcg Oral QAC breakfast   linaclotide  72 mcg Oral QHS   liothyronine  2.5 mcg Oral QAC breakfast   metoprolol succinate  100 mg Oral Daily   pantoprazole  40 mg Oral Daily   Continuous Infusions:  lactated ringers 100 mL/hr at 01/24/23 0623      LOS: 1 day   Marinda Elk  [facility]  01/24/2023, 7:06 AM

## 2023-01-24 NOTE — Plan of Care (Signed)
  Problem: Clinical Measurements: Goal: Will remain free from infection Outcome: Progressing   Problem: Elimination: Goal: Will not experience complications related to bowel motility Outcome: Progressing   Problem: Pain Managment: Goal: General experience of comfort will improve Outcome: Progressing   

## 2023-01-25 DIAGNOSIS — E1010 Type 1 diabetes mellitus with ketoacidosis without coma: Secondary | ICD-10-CM | POA: Diagnosis not present

## 2023-01-25 LAB — HEMOGLOBIN A1C
Hgb A1c MFr Bld: 8.7 % — ABNORMAL HIGH (ref 4.8–5.6)
Mean Plasma Glucose: 203 mg/dL

## 2023-01-25 LAB — GLUCOSE, CAPILLARY: Glucose-Capillary: 117 mg/dL — ABNORMAL HIGH (ref 70–99)

## 2023-01-25 NOTE — Progress Notes (Signed)
Patient provided with discharge education, patient verbalized understanding.  

## 2023-01-25 NOTE — Progress Notes (Signed)
   01/25/23 0906  TOC Brief Assessment  Insurance and Status Reviewed  Patient has primary care physician Yes  Home environment has been reviewed Home with spouse  Prior level of function: Independent  Prior/Current Home Services No current home services  Social Determinants of Health Reivew SDOH reviewed no interventions necessary  Readmission risk has been reviewed Yes  Transition of care needs no transition of care needs at this time

## 2023-01-25 NOTE — Discharge Summary (Signed)
Physician Discharge Summary  Alejandra Valdez FIE:[id number] DOB: [birth year] DOA: 01/23/2023  PCP: Ciro Backer, FNP  Admit date: 01/23/2023 Discharge date: 01/25/2023  Admitted From: Home Disposition:  Home  Recommendations for Outpatient Follow-up:  Follow up with PCP in 1-2 weeks Please obtain BMP/CBC in one week   Home Health:No Equipment/Devices:None  Discharge Condition:Stable CODE STATUS:Full Diet recommendation: Heart Healthy  Brief/Interim Summary: 67 y.o. female past medical history seen for anxiety depression, diabetes mellitus type 1 on an insulin pump, hypothyroidism comes into the emergency room DKA   Discharge Diagnoses:  Principal Problem:   DKA, type 1 (HCC) Active Problems:   S/P CABG x 4   Chronic constipation   Fibromyalgia   Depression   Hyperlipidemia  Diabetes mellitus type 1/DKA: Secondary to insulin pump failure, she was started on IV insulin IV fluids. Creatinine gap closed her bicarb got corrected. She was transition to long-acting insulin plus sliding scale no changes made to her medication she will continue current regimen.  Hypovolemic hyponatremia: Resolved with IV fluids.  Normocytic anemia: No signs of overt bleeding follow-up with PCP as an outpatient.  Hyperlipidemia: Continue statin study.  Depression: Continue Prozac.  Fibromyalgia/chronic pain: Continue gabapentin.  Mood disorder: Continue lamotrigine.  Essential hypertension: Continue Toprol.  GERD: Continue PPI.  Discharge Instructions  Discharge Instructions     Diet - low sodium heart healthy   Complete by: As directed    Increase activity slowly   Complete by: As directed       Allergies as of 01/25/2023       Reactions   Vicodin [hydrocodone-acetaminophen] Other (See Comments)   Prefers to not be prescribed this medication        Medication List     TAKE these medications    Aimovig 140 MG/ML Soaj Generic drug: Erenumab-aooe Inject 140  mg into the skin every 28 (twenty-eight) days.   aspirin EC 81 MG tablet Take 1 tablet (81 mg total) by mouth daily.   atorvastatin 40 MG tablet Commonly known as: LIPITOR Take 40 mg by mouth at bedtime.   baclofen 10 MG tablet Commonly known as: LIORESAL Take 10 mg by mouth daily as needed for muscle spasms.   Bayer Contour Next Test test strip Generic drug: glucose blood 150 each as needed.   botulinum toxin Type A 200 units injection Commonly known as: BOTOX Inject as directed See admin instructions. Once every 3 months for migraines   cholecalciferol 25 MCG (1000 UNIT) tablet Commonly known as: VITAMIN D3 Take 1,000 Units by mouth daily.   cycloSPORINE 0.05 % ophthalmic emulsion Commonly known as: RESTASIS Place 1 drop into both eyes 2 (two) times daily.   Dexcom G6 Sensor Misc Inject 1 Device into the skin See admin instructions. Place 1 new sensor into the skin every 10 days   diclofenac 50 MG EC tablet Commonly known as: VOLTAREN Take 50 mg by mouth daily.   ezetimibe 10 MG tablet Commonly known as: ZETIA TAKE 1 TABLET BY MOUTH DAILY   Fiasp 100 UNIT/ML Soln Generic drug: Insulin Aspart (w/Niacinamide) Inject as directed See admin instructions. For use in pump (Omnipod 5 G-6)   FLUoxetine 40 MG capsule Commonly known as: PROZAC Take 40 mg by mouth daily.   gabapentin 300 MG capsule Commonly known as: NEURONTIN Take 900 mg by mouth at bedtime.   ibandronate 150 MG tablet Commonly known as: BONIVA TAKE 1 TABLET BY MOUTH EVERY 30 DAYS What changed: See the new instructions.  insulin aspart 100 UNIT/ML injection Commonly known as: NovoLOG Use up to 75 units daily by insulin pump.   Ketone Test Strp by Does not apply route.   lactulose 10 GM/15ML solution Commonly known as: CHRONULAC Take 10 g by mouth daily as needed for mild constipation.   lamoTRIgine 200 MG tablet Commonly known as: LAMICTAL Take 200 mg by mouth daily.   lamoTRIgine 100  MG tablet Commonly known as: LAMICTAL Take 1 tablet (100 mg total) daily by mouth.   levothyroxine 112 MCG tablet Commonly known as: SYNTHROID Take 112 mcg by mouth daily before breakfast.   Linzess 72 MCG capsule Generic drug: linaclotide Take 72 mcg by mouth at bedtime.   liothyronine 5 MCG tablet Commonly known as: CYTOMEL Take 2.5 mcg by mouth daily before breakfast.   Lumify 0.025 % Soln Generic drug: Brimonidine Tartrate Place 1 drop into both eyes 2 (two) times daily.   metoprolol succinate 100 MG 24 hr tablet Commonly known as: TOPROL-XL Take 100 mg by mouth daily. Take with or immediately following a meal.   nitroGLYCERIN 0.4 MG SL tablet Commonly known as: NITROSTAT DISSOLVE 1 TABLET UNDER THE TONGUE EVERY 5 MINUTES FOR UP TO 3 DOSES AS NEEDED FOR CHEST PAIN What changed:  how much to take how to take this when to take this reasons to take this additional instructions   omeprazole 40 MG capsule Commonly known as: PRILOSEC TAKE ONE CAPSULE BY MOUTH EVERY DAY What changed: when to take this   Omnipod 5 G6 Pods (Gen 5) Misc Inject 1 Device into the skin every 3 (three) days.   promethazine 25 MG tablet Commonly known as: PHENERGAN Take 25 mg by mouth every 8 (eight) hours as needed for nausea or vomiting.   Spravato (84 MG Dose) 28 MG/DEVICE Sopk Generic drug: Esketamine HCl (84 MG Dose) Place 1 spray into both nostrils See admin instructions. Instill 1 spray into each nostril (or as otherwise directed) on Wednesdays and Fridays   tretinoin 0.1 % cream Commonly known as: RETIN-A Apply 1 application  topically at bedtime.   Ubrelvy 100 MG Tabs Generic drug: Ubrogepant Take 100 mg by mouth See admin instructions. Take 100 mg by mouth as directed for a migraine. May take an additional 100 mg as needed, if no relief after 2 hours.   zaleplon 10 MG capsule Commonly known as: SONATA Take 10 mg by mouth at bedtime.        Allergies  Allergen  Reactions   Vicodin [Hydrocodone-Acetaminophen] Other (See Comments)    Prefers to not be prescribed this medication    Consultations: None   Procedures/Studies: No results found.  Subjective: No complaint  Discharge Exam: Vitals:   01/24/23 2323 01/25/23 0350  BP: (!) 116/56 117/61  Pulse: 64 66  Resp: 18 18  Temp: 99.2 F (37.3 C) 97.6 F (36.4 C)  SpO2: 98% 97%   Vitals:   01/24/23 1106 01/24/23 1943 01/24/23 2323 01/25/23 0350  BP: (!) 98/49 (!) 115/58 (!) 116/56 117/61  Pulse: 72 70 64 66  Resp: 18 18 18 18   Temp: 98.3 F (36.8 C) 98.1 F (36.7 C) 99.2 F (37.3 C) 97.6 F (36.4 C)  TempSrc:  Oral Oral Oral  SpO2: 99% 97% 98% 97%  Weight:      Height:        General: Pt is alert, awake, not in acute distress Cardiovascular: RRR, S1/S2 +, no rubs, no gallops Respiratory: CTA bilaterally, no wheezing, no rhonchi Abdominal:  Soft, NT, ND, bowel sounds + Extremities: no edema, no cyanosis    The results of significant diagnostics from this hospitalization (including imaging, microbiology, ancillary and laboratory) are listed below for reference.     Microbiology: Recent Results (from the past 240 hour(s))  MRSA Next Gen by PCR, Nasal     Status: None   Collection Time: 01/24/23  1:22 AM   Specimen: Nasal Mucosa; Nasal Swab  Result Value Ref Range Status   MRSA by PCR Next Gen NOT DETECTED NOT DETECTED Final    Comment: (NOTE) The GeneXpert MRSA Assay (FDA approved for NASAL specimens only), is one component of a comprehensive MRSA colonization surveillance program. It is not intended to diagnose MRSA infection nor to guide or monitor treatment for MRSA infections. Test performance is not FDA approved in patients less than 63 years old. Performed at [facility], [address] [place], [place] [address]      Labs: BNP (last 3 results) No results for input(s): "BNP" in the last 8760 hours. Basic Metabolic Panel: Recent Labs   Lab 01/24/23 0253 01/24/23 1104 01/24/23 1627 01/24/23 2255 01/25/23 0516  NA 134* 135 136 137 139  K 4.1 3.7 3.6 3.6 3.6  CL 104 102 103 103 105  CO2 18* 26 25 26 25   GLUCOSE 204* 74 134* 143* 119*  BUN 25* 23 20 19 16   CREATININE 0.76 0.75 0.70 0.62 0.71  CALCIUM 8.1* 8.2* 8.7* 8.8* 8.6*   Liver Function Tests: Recent Labs  Lab 01/23/23 1632  AST 18  ALT 24  ALKPHOS 115  BILITOT 1.4*  PROT 6.8  ALBUMIN 3.7   Recent Labs  Lab 01/23/23 1632  LIPASE 24   No results for input(s): "AMMONIA" in the last 168 hours. CBC: Recent Labs  Lab 01/23/23 1632 01/23/23 2256  WBC 9.3 8.0  NEUTROABS 8.2*  --   HGB 13.2 11.8*  HCT 40.9 37.2  MCV 96.5 96.1  PLT 251 239   Cardiac Enzymes: No results for input(s): "CKTOTAL", "CKMB", "CKMBINDEX", "TROPONINI" in the last 168 hours. BNP: Invalid input(s): "POCBNP" CBG: Recent Labs  Lab 01/24/23 0745 01/24/23 1138 01/24/23 1626 01/24/23 2111 01/25/23 0730  GLUCAP 91 82 125* 173* 117*   D-Dimer No results for input(s): "DDIMER" in the last 72 hours. Hgb A1c No results for input(s): "HGBA1C" in the last 72 hours. Lipid Profile No results for input(s): "CHOL", "HDL", "LDLCALC", "TRIG", "CHOLHDL", "LDLDIRECT" in the last 72 hours. Thyroid function studies No results for input(s): "TSH", "T4TOTAL", "T3FREE", "THYROIDAB" in the last 72 hours.  Invalid input(s): "FREET3" Anemia work up No results for input(s): "VITAMINB12", "FOLATE", "FERRITIN", "TIBC", "IRON", "RETICCTPCT" in the last 72 hours. Urinalysis    Component Value Date/Time   COLORURINE STRAW (A) 01/23/2023 1618   APPEARANCEUR CLEAR 01/23/2023 1618   LABSPEC 1.024 01/23/2023 1618   PHURINE 5.0 01/23/2023 1618   GLUCOSEU >=500 (A) 01/23/2023 1618   HGBUR NEGATIVE 01/23/2023 1618   BILIRUBINUR NEGATIVE 01/23/2023 1618   KETONESUR 80 (A) 01/23/2023 1618   PROTEINUR NEGATIVE 01/23/2023 1618   NITRITE NEGATIVE 01/23/2023 1618   LEUKOCYTESUR NEGATIVE  01/23/2023 1618   Sepsis Labs Recent Labs  Lab 01/23/23 1632 01/23/23 2256  WBC 9.3 8.0   Microbiology Recent Results (from the past 240 hour(s))  MRSA Next Gen by PCR, Nasal     Status: None   Collection Time: 01/24/23  1:22 AM   Specimen: Nasal Mucosa; Nasal Swab  Result Value Ref Range Status  MRSA by PCR Next Gen NOT DETECTED NOT DETECTED Final    Comment: (NOTE) The GeneXpert MRSA Assay (FDA approved for NASAL specimens only), is one component of a comprehensive MRSA colonization surveillance program. It is not intended to diagnose MRSA infection nor to guide or monitor treatment for MRSA infections. Test performance is not FDA approved in patients less than 59 years old. Performed at [facility], [address] [place], [place] [address]     SIGNED:   Marinda Elk, MD  [facility] 01/25/2023, 8:09 AM Pager   If 7PM-7AM, please contact night-coverage [email or web address] Password [email or web address]

## 2023-01-25 NOTE — Plan of Care (Signed)
  Problem: Education: Goal: Knowledge of General Education information will improve Description: Including pain rating scale, medication(s)/side effects and non-pharmacologic comfort measures Outcome: Progressing   Problem: Clinical Measurements: Goal: Diagnostic test results will improve Outcome: Progressing   Problem: Safety: Goal: Ability to remain free from injury will improve Outcome: Progressing   Problem: Education: Goal: Ability to describe self-care measures that may prevent or decrease complications (Diabetes Survival Skills Education) will improve Outcome: Progressing Goal: Individualized Educational Video(s) Outcome: Progressing

## 2024-02-29 ENCOUNTER — Encounter: Payer: Self-pay | Admitting: Sports Medicine
